# Patient Record
Sex: Male | Born: 1985 | Race: White | Hispanic: No | Smoking: Current every day smoker
Health system: Southern US, Community
[De-identification: ages and names within clinical notes are randomized; demographics above are authoritative.]

---

## 2008-12-06 DEATH — deceased

## 2012-03-03 ENCOUNTER — Emergency Department (HOSPITAL_COMMUNITY)
Admission: EM | Admit: 2012-03-03 | Discharge: 2012-03-03 | Disposition: A | Discharge status: Home / Self Care | Payer: Self-pay | Attending: Emergency Medicine | Admitting: Emergency Medicine

## 2012-03-03 ENCOUNTER — Encounter (HOSPITAL_COMMUNITY): Payer: Self-pay | Admitting: Emergency Medicine

## 2012-03-03 DIAGNOSIS — T23209A Burn of second degree of unspecified hand, unspecified site, initial encounter: Secondary | ICD-10-CM

## 2012-03-03 DIAGNOSIS — X19XXXA Contact with other heat and hot substances, initial encounter: Secondary | ICD-10-CM | POA: Insufficient documentation

## 2012-03-03 DIAGNOSIS — F17200 Nicotine dependence, unspecified, uncomplicated: Secondary | ICD-10-CM | POA: Insufficient documentation

## 2012-03-03 DIAGNOSIS — T23239A Burn of second degree of unspecified multiple fingers (nail), not including thumb, initial encounter: Secondary | ICD-10-CM | POA: Insufficient documentation

## 2012-03-03 DIAGNOSIS — T23259A Burn of second degree of unspecified palm, initial encounter: Secondary | ICD-10-CM | POA: Insufficient documentation

## 2012-03-03 DIAGNOSIS — T310 Burns involving less than 10% of body surface: Secondary | ICD-10-CM | POA: Insufficient documentation

## 2012-03-03 MED ORDER — SILVER SULFADIAZINE 1 % EX CREA
TOPICAL_CREAM | Freq: Once | CUTANEOUS | Status: AC
  Administered 2012-03-03: 13:00:00 via TOPICAL
  Filled 2012-03-03: qty 50

## 2012-03-03 MED ORDER — SILVER SULFADIAZINE 1 % EX CREA: TOPICAL_CREAM | Freq: Two times a day (BID) | CUTANEOUS | Status: DC

## 2012-03-03 MED ORDER — CEPHALEXIN 500 MG PO CAPS: ORAL_CAPSULE | ORAL | Status: DC

## 2012-03-03 MED ORDER — IBUPROFEN 800 MG PO TABS
800.0000 mg | ORAL_TABLET | Freq: Once | ORAL | Status: AC
  Administered 2012-03-03: 800 mg via ORAL
  Filled 2012-03-03: qty 1

## 2012-03-03 MED ORDER — HYDROCODONE-ACETAMINOPHEN 5-500 MG PO TABS: 1.0000 | ORAL_TABLET | Freq: Four times a day (QID) | ORAL | Status: AC | PRN
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
## 2012-03-03 NOTE — Progress Notes (Signed)
[facility] ED CM noted self pay pt from [facility] with no pcp  Offered the following resources [employer] [facility], [employer]. Location: [address] [place], [place] [address] [phone] Services: [facility] patients of all ages receive quality medical care from our on staff physicians and physicians assistant for any variety of health concerns both acute and chronic [facility]: [phone], [address] [employer]
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
## 2012-07-18 ENCOUNTER — Encounter (HOSPITAL_COMMUNITY): Payer: Self-pay | Admitting: Emergency Medicine

## 2012-07-18 ENCOUNTER — Emergency Department (HOSPITAL_COMMUNITY)
Admission: EM | Admit: 2012-07-18 | Discharge: 2012-07-18 | Disposition: A | Discharge status: Home / Self Care | Payer: Self-pay | Attending: Emergency Medicine | Admitting: Emergency Medicine

## 2012-07-18 DIAGNOSIS — L02419 Cutaneous abscess of limb, unspecified: Secondary | ICD-10-CM | POA: Insufficient documentation

## 2012-07-18 DIAGNOSIS — F17200 Nicotine dependence, unspecified, uncomplicated: Secondary | ICD-10-CM | POA: Insufficient documentation

## 2012-07-18 DIAGNOSIS — L0390 Cellulitis, unspecified: Secondary | ICD-10-CM

## 2012-07-18 MED ORDER — IBUPROFEN 600 MG PO TABS: 600.0000 mg | ORAL_TABLET | Freq: Three times a day (TID) | ORAL | Status: DC | PRN

## 2012-07-18 MED ORDER — OXYCODONE-ACETAMINOPHEN 5-325 MG PO TABS
1.0000 | ORAL_TABLET | Freq: Once | ORAL | Status: AC
  Administered 2012-07-18: 1 via ORAL
  Filled 2012-07-18: qty 1

## 2012-07-18 MED ORDER — SULFAMETHOXAZOLE-TRIMETHOPRIM 800-160 MG PO TABS: 1.0000 | ORAL_TABLET | Freq: Two times a day (BID) | ORAL | Status: DC
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
## 2012-07-18 NOTE — ED Notes (Signed)
Pt presenting to ed with c/o abscess to left hip x 3 days. Pt states "I think I got bite by a spider" pt states pain is shooting all the way down my leg

## 2012-07-18 NOTE — ED Notes (Signed)
Pt states he has had an abscess on his L hip x 3 days, yesterday started having sharp/shooting pain from hip up his L side, pt denies drainage from area, states he's been putting alcohol on spot, pt denies nausea/vomiting, states started having diarrhea 2 days ago.

## 2014-05-11 ENCOUNTER — Encounter (HOSPITAL_COMMUNITY): Payer: Self-pay | Admitting: Emergency Medicine

## 2014-05-11 ENCOUNTER — Emergency Department (HOSPITAL_COMMUNITY)
Admission: EM | Admit: 2014-05-11 | Discharge: 2014-05-11 | Disposition: A | Discharge status: Home / Self Care | Payer: Self-pay | Attending: Emergency Medicine | Admitting: Emergency Medicine

## 2014-05-11 DIAGNOSIS — W219XXA Striking against or struck by unspecified sports equipment, initial encounter: Secondary | ICD-10-CM | POA: Insufficient documentation

## 2014-05-11 DIAGNOSIS — F17200 Nicotine dependence, unspecified, uncomplicated: Secondary | ICD-10-CM | POA: Insufficient documentation

## 2014-05-11 DIAGNOSIS — Z792 Long term (current) use of antibiotics: Secondary | ICD-10-CM | POA: Insufficient documentation

## 2014-05-11 DIAGNOSIS — K0889 Other specified disorders of teeth and supporting structures: Secondary | ICD-10-CM

## 2014-05-11 DIAGNOSIS — S025XXA Fracture of tooth (traumatic), initial encounter for closed fracture: Secondary | ICD-10-CM | POA: Insufficient documentation

## 2014-05-11 DIAGNOSIS — Y9367 Activity, basketball: Secondary | ICD-10-CM | POA: Insufficient documentation

## 2014-05-11 DIAGNOSIS — Y92838 Other recreation area as the place of occurrence of the external cause: Secondary | ICD-10-CM

## 2014-05-11 DIAGNOSIS — Y9239 Other specified sports and athletic area as the place of occurrence of the external cause: Secondary | ICD-10-CM | POA: Insufficient documentation

## 2014-05-11 MED ORDER — HYDROCODONE-ACETAMINOPHEN 5-325 MG PO TABS: 1.0000 | ORAL_TABLET | Freq: Four times a day (QID) | ORAL | Status: DC | PRN

## 2014-05-11 MED ORDER — PENICILLIN V POTASSIUM 500 MG PO TABS: 500.0000 mg | ORAL_TABLET | Freq: Four times a day (QID) | ORAL | Status: DC

## 2014-05-11 NOTE — ED Provider Notes (Signed)
Medical screening examination/treatment/procedure(s) were performed by non-physician practitioner and as supervising physician I was immediately available for consultation/collaboration.   EKG Interpretation None      Arianne Klinge, MD, FACEP   Meiya Wisler L Bethsaida Siegenthaler, MD 05/11/14 2237 

## 2014-05-11 NOTE — Discharge Instructions (Signed)
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
## 2014-05-11 NOTE — ED Provider Notes (Signed)
CSN: [id number]     Arrival date & time 05/11/14  1506 History  This chart was scribed for a non-physician practitioner, Creola Corn, PA-C working with Ward Givens, MD by [place] Peace, ED Scribe. The patient was seen in [facility]. The patient's care was started at 4:20 PM.    Chief Complaint  Patient presents with  . Dental Injury    trauma to tooth in r/lower jaw     Patient is a 28 y.o. male presenting with tooth pain. The history is provided by the patient. No language interpreter was used.  Dental Pain Location:  Lower Quality:  Throbbing Severity:  Severe Duration:  2 days Progression:  Unchanged Context: crown fracture   Associated symptoms: facial pain   Associated symptoms: no congestion, no difficulty swallowing and no facial swelling   Risk factors: lack of dental care and smoking   HPI Comments: Vincent Craig is a 28 y.o. male who presents to the Emergency Department complaining of dental pain to right lower aspect of mouth onset 2 days where he reports that he was hit with an elbow while playing basketball. Pt states that he sustained a broken tooth and has been in severe pain since. He reports that pain is at it's worst at night and causes him to experience some trouble sleeping. He denies currently having a dentist. Pt is current every day smoker (1 pack/daily).   History reviewed. No pertinent past medical history. History reviewed. No pertinent past surgical history. History reviewed. No pertinent family history. History  Substance Use Topics  . Smoking status: Current Every Day Smoker -- 1.00 packs/day    Types: Cigarettes  . Smokeless tobacco: Not on file  . Alcohol Use: Yes     Comment: weekends    Review of Systems  HENT: Positive for dental problem (right lower). Negative for congestion and facial swelling.   Gastrointestinal: Negative for nausea and vomiting.  Psychiatric/Behavioral:       Trouble sleeping.       Allergies  Review of patient's  allergies indicates no known allergies.  Home Medications   Prior to Admission medications   Medication Sig Start Date End Date Taking? Authorizing Provider  acetaminophen (TYLENOL) 500 MG tablet Take 500 mg by mouth every 6 (six) hours as needed. For pain    Historical Provider, MD  ibuprofen (ADVIL,MOTRIN) 600 MG tablet Take 1 tablet (600 mg total) by mouth every 8 (eight) hours as needed for pain. 07/18/12   Lyanne Co, MD  sulfamethoxazole-trimethoprim (SEPTRA DS) 800-160 MG per tablet Take 1 tablet by mouth every 12 (twelve) hours. 07/18/12   Lyanne Co, MD   BP 119/73  Pulse 84  Temp(Src) 98.6 F (37 C) (Oral)  Resp 18  Wt 160 lb (72.576 kg)  SpO2 98% Physical Exam  Nursing note and vitals reviewed. Constitutional: He is oriented to person, place, and time. He appears well-developed and well-nourished. No distress.  HENT:  Head: Normocephalic and atraumatic.  Mouth/Throat:    Fair dentition throughout.  Affected tooth as diagrammed.  No signs of peritonsillar or tonsillar abscess.  No signs of gingival abscess. Oropharynx is clear and without exudates.  Uvula is midline.  Airway is intact. No signs of Ludwig's angina with palpation of oral and sublingual mucosa.   Eyes: Conjunctivae and EOM are normal.  Neck: Neck supple. No tracheal deviation present.  Cardiovascular: Normal rate.   Pulmonary/Chest: Effort normal. No respiratory distress.  Musculoskeletal: Normal range of motion.  Neurological:  He is alert and oriented to person, place, and time.  Skin: Skin is warm and dry.  Psychiatric: He has a normal mood and affect. His behavior is normal.    ED Course  Procedures (including critical care time) Labs Review Labs Reviewed - No data to display  No results found for this or any previous visit. No results found.   Imaging Review No results found.   EKG Interpretation None     Medications - No data to display  4:25 PM- Treatment plan was discussed  with patient who verbalizes understanding and agrees.   MDM   Final diagnoses:  Pain, dental    Patient with toothache.  No gross abscess.  Exam unconcerning for Ludwig's angina or spread of infection.  Will treat with penicillin and pain medicine.  Urged patient to follow-up with dentist.     I personally performed the services described in this documentation, which was scribed in my presence. The recorded information has been reviewed and is accurate.     Roxy Horseman, PA-C 05/11/14 1655

## 2015-09-13 ENCOUNTER — Encounter (HOSPITAL_COMMUNITY): Payer: Self-pay | Admitting: Emergency Medicine

## 2015-09-13 ENCOUNTER — Emergency Department (HOSPITAL_COMMUNITY): Payer: Self-pay

## 2015-09-13 ENCOUNTER — Inpatient Hospital Stay (HOSPITAL_COMMUNITY)
Admission: EM | Admit: 2015-09-13 | Discharge: 2015-09-16 | DRG: 443 | Disposition: A | Discharge status: Home / Self Care | Payer: Self-pay | Attending: Internal Medicine | Admitting: Internal Medicine

## 2015-09-13 DIAGNOSIS — B182 Chronic viral hepatitis C: Secondary | ICD-10-CM | POA: Diagnosis present

## 2015-09-13 DIAGNOSIS — R7401 Elevation of levels of liver transaminase levels: Secondary | ICD-10-CM | POA: Diagnosis present

## 2015-09-13 DIAGNOSIS — R740 Nonspecific elevation of levels of transaminase and lactic acid dehydrogenase [LDH]: Secondary | ICD-10-CM

## 2015-09-13 DIAGNOSIS — B1710 Acute hepatitis C without hepatic coma: Principal | ICD-10-CM | POA: Diagnosis present

## 2015-09-13 DIAGNOSIS — R17 Unspecified jaundice: Secondary | ICD-10-CM | POA: Diagnosis present

## 2015-09-13 DIAGNOSIS — F1290 Cannabis use, unspecified, uncomplicated: Secondary | ICD-10-CM | POA: Diagnosis present

## 2015-09-13 DIAGNOSIS — K7200 Acute and subacute hepatic failure without coma: Secondary | ICD-10-CM

## 2015-09-13 DIAGNOSIS — K759 Inflammatory liver disease, unspecified: Secondary | ICD-10-CM | POA: Insufficient documentation

## 2015-09-13 DIAGNOSIS — F17210 Nicotine dependence, cigarettes, uncomplicated: Secondary | ICD-10-CM | POA: Diagnosis present

## 2015-09-13 DIAGNOSIS — F1010 Alcohol abuse, uncomplicated: Secondary | ICD-10-CM | POA: Diagnosis present

## 2015-09-13 DIAGNOSIS — E876 Hypokalemia: Secondary | ICD-10-CM | POA: Diagnosis present

## 2015-09-13 LAB — CBC WITH DIFFERENTIAL/PLATELET
Basophils Absolute: 0.1 10*3/uL (ref 0.0–0.1)
Basophils Relative: 1 %
EOS ABS: 0.3 10*3/uL (ref 0.0–0.7)
Eosinophils Relative: 3 %
HEMATOCRIT: 36.9 % — AB (ref 39.0–52.0)
HEMOGLOBIN: 12.8 g/dL — AB (ref 13.0–17.0)
LYMPHS ABS: 1.7 10*3/uL (ref 0.7–4.0)
Lymphocytes Relative: 17 %
MCH: 31.8 pg (ref 26.0–34.0)
MCHC: 34.7 g/dL (ref 30.0–36.0)
MCV: 91.6 fL (ref 78.0–100.0)
MONO ABS: 1.6 10*3/uL — AB (ref 0.1–1.0)
MONOS PCT: 16 %
NEUTROS PCT: 63 %
Neutro Abs: 6 10*3/uL (ref 1.7–7.7)
Platelets: 378 10*3/uL (ref 150–400)
RBC: 4.03 MIL/uL — ABNORMAL LOW (ref 4.22–5.81)
RDW: 15.3 % (ref 11.5–15.5)
WBC: 9.6 10*3/uL (ref 4.0–10.5)

## 2015-09-13 LAB — COMPREHENSIVE METABOLIC PANEL
ALK PHOS: 262 U/L — AB (ref 38–126)
ALT: 1045 U/L — ABNORMAL HIGH (ref 17–63)
ANION GAP: 10 (ref 5–15)
AST: 822 U/L — ABNORMAL HIGH (ref 15–41)
Albumin: 3.3 g/dL — ABNORMAL LOW (ref 3.5–5.0)
BILIRUBIN TOTAL: 20.6 mg/dL — AB (ref 0.3–1.2)
BUN: 12 mg/dL (ref 6–20)
CALCIUM: 9.3 mg/dL (ref 8.9–10.3)
CO2: 28 mmol/L (ref 22–32)
CREATININE: 0.7 mg/dL (ref 0.61–1.24)
Chloride: 97 mmol/L — ABNORMAL LOW (ref 101–111)
GFR calc non Af Amer: >60 mL/min (ref >60)
Glucose, Bld: 127 mg/dL — ABNORMAL HIGH (ref 65–99)
Potassium: 3.4 mmol/L — ABNORMAL LOW (ref 3.5–5.1)
SODIUM: 135 mmol/L (ref 135–145)
TOTAL PROTEIN: 6.3 g/dL — AB (ref 6.5–8.1)

## 2015-09-13 LAB — URINALYSIS, ROUTINE W REFLEX MICROSCOPIC
Glucose, UA: NEGATIVE mg/dL
HGB URINE DIPSTICK: NEGATIVE
KETONES UR: NEGATIVE mg/dL
NITRITE: POSITIVE — AB
PROTEIN: NEGATIVE mg/dL
SPECIFIC GRAVITY, URINE: 1.024 (ref 1.005–1.030)
pH: 6 (ref 5.0–8.0)

## 2015-09-13 LAB — MAGNESIUM: MAGNESIUM: 2.1 mg/dL (ref 1.7–2.4)

## 2015-09-13 LAB — RAPID URINE DRUG SCREEN, HOSP PERFORMED
Amphetamines: POSITIVE — AB
BENZODIAZEPINES: NOT DETECTED
Barbiturates: NOT DETECTED
COCAINE: NOT DETECTED
OPIATES: POSITIVE — AB
TETRAHYDROCANNABINOL: POSITIVE — AB

## 2015-09-13 LAB — CBC
HCT: 37.9 % — ABNORMAL LOW (ref 39.0–52.0)
HEMOGLOBIN: 13 g/dL (ref 13.0–17.0)
MCH: 31.6 pg (ref 26.0–34.0)
MCHC: 34.3 g/dL (ref 30.0–36.0)
MCV: 92.2 fL (ref 78.0–100.0)
Platelets: 387 10*3/uL (ref 150–400)
RBC: 4.11 MIL/uL — ABNORMAL LOW (ref 4.22–5.81)
RDW: 15.4 % (ref 11.5–15.5)
WBC: 8.9 10*3/uL (ref 4.0–10.5)

## 2015-09-13 LAB — HEPATIC FUNCTION PANEL
ALT: 1022 U/L — AB (ref 17–63)
AST: 859 U/L — AB (ref 15–41)
Albumin: 3.5 g/dL (ref 3.5–5.0)
Alkaline Phosphatase: 274 U/L — ABNORMAL HIGH (ref 38–126)
BILIRUBIN DIRECT: 11.7 mg/dL — AB (ref 0.1–0.5)
BILIRUBIN TOTAL: 20.4 mg/dL — AB (ref 0.3–1.2)
Indirect Bilirubin: 8.7 mg/dL — ABNORMAL HIGH (ref 0.3–0.9)
Total Protein: 6.6 g/dL (ref 6.5–8.1)

## 2015-09-13 LAB — URINE MICROSCOPIC-ADD ON

## 2015-09-13 LAB — PHOSPHORUS: Phosphorus: 3.1 mg/dL (ref 2.5–4.6)

## 2015-09-13 LAB — ACETAMINOPHEN LEVEL: Acetaminophen (Tylenol), Serum: <10 ug/mL — ABNORMAL LOW (ref 10–30)

## 2015-09-13 LAB — ETHANOL

## 2015-09-13 LAB — LIPASE, BLOOD: LIPASE: 17 U/L (ref 11–51)

## 2015-09-13 LAB — APTT: APTT: 34 s (ref 24–37)

## 2015-09-13 LAB — PROTIME-INR
INR: 1.06 (ref 0.00–1.49)
PROTHROMBIN TIME: 14 s (ref 11.6–15.2)

## 2015-09-13 LAB — TSH: TSH: 0.482 u[IU]/mL (ref 0.350–4.500)

## 2015-09-13 MED ORDER — SODIUM CHLORIDE 0.9 % IV BOLUS (SEPSIS)
1000.0000 mL | Freq: Once | INTRAVENOUS | Status: AC
  Administered 2015-09-13: 1000 mL via INTRAVENOUS

## 2015-09-13 MED ORDER — LORAZEPAM 2 MG/ML IJ SOLN
1.0000 mg | Freq: Four times a day (QID) | INTRAMUSCULAR | Status: DC | PRN
  Administered 2015-09-14: 1 mg via INTRAVENOUS
  Filled 2015-09-13: qty 1

## 2015-09-13 MED ORDER — ONDANSETRON HCL 4 MG/2ML IJ SOLN
4.0000 mg | Freq: Once | INTRAMUSCULAR | Status: AC
  Administered 2015-09-13: 4 mg via INTRAVENOUS
  Filled 2015-09-13: qty 2

## 2015-09-13 MED ORDER — SODIUM CHLORIDE 0.9 % IJ SOLN
3.0000 mL | Freq: Two times a day (BID) | INTRAMUSCULAR | Status: DC
  Administered 2015-09-13 – 2015-09-14 (×2): 3 mL via INTRAVENOUS

## 2015-09-13 MED ORDER — FOLIC ACID 1 MG PO TABS
1.0000 mg | ORAL_TABLET | Freq: Every day | ORAL | Status: DC
  Administered 2015-09-13 – 2015-09-15 (×3): 1 mg via ORAL
  Filled 2015-09-13 (×4): qty 1

## 2015-09-13 MED ORDER — THIAMINE HCL 100 MG/ML IJ SOLN
100.0000 mg | Freq: Every day | INTRAMUSCULAR | Status: DC
  Filled 2015-09-13 (×4): qty 1

## 2015-09-13 MED ORDER — ONDANSETRON HCL 4 MG PO TABS: 4.0000 mg | ORAL_TABLET | Freq: Four times a day (QID) | ORAL | Status: DC | PRN

## 2015-09-13 MED ORDER — MORPHINE SULFATE (PF) 4 MG/ML IV SOLN
4.0000 mg | Freq: Once | INTRAVENOUS | Status: AC
  Administered 2015-09-13: 4 mg via INTRAVENOUS
  Filled 2015-09-13: qty 1

## 2015-09-13 MED ORDER — ENOXAPARIN SODIUM 40 MG/0.4ML ~~LOC~~ SOLN
40.0000 mg | SUBCUTANEOUS | Status: DC
  Administered 2015-09-13 – 2015-09-15 (×3): 40 mg via SUBCUTANEOUS
  Filled 2015-09-13 (×4): qty 0.4

## 2015-09-13 MED ORDER — ADULT MULTIVITAMIN W/MINERALS CH
1.0000 | ORAL_TABLET | Freq: Every day | ORAL | Status: DC
  Administered 2015-09-13 – 2015-09-15 (×3): 1 via ORAL
  Filled 2015-09-13 (×4): qty 1

## 2015-09-13 MED ORDER — VITAMIN B-1 100 MG PO TABS
100.0000 mg | ORAL_TABLET | Freq: Every day | ORAL | Status: DC
  Administered 2015-09-13 – 2015-09-15 (×3): 100 mg via ORAL
  Filled 2015-09-13 (×4): qty 1

## 2015-09-13 MED ORDER — ONDANSETRON HCL 4 MG/2ML IJ SOLN
4.0000 mg | Freq: Four times a day (QID) | INTRAMUSCULAR | Status: DC | PRN
  Filled 2015-09-13: qty 2

## 2015-09-13 MED ORDER — OXYCODONE HCL 5 MG PO TABS
5.0000 mg | ORAL_TABLET | ORAL | Status: DC | PRN
  Administered 2015-09-13 – 2015-09-16 (×9): 5 mg via ORAL
  Filled 2015-09-13 (×9): qty 1

## 2015-09-13 MED ORDER — DIPHENHYDRAMINE HCL 25 MG PO CAPS
25.0000 mg | ORAL_CAPSULE | Freq: Four times a day (QID) | ORAL | Status: DC | PRN
  Administered 2015-09-13: 25 mg via ORAL
  Filled 2015-09-13: qty 1

## 2015-09-13 MED ORDER — LORAZEPAM 1 MG PO TABS
1.0000 mg | ORAL_TABLET | Freq: Four times a day (QID) | ORAL | Status: DC | PRN
  Administered 2015-09-13 – 2015-09-15 (×4): 1 mg via ORAL
  Filled 2015-09-13 (×4): qty 1

## 2015-09-13 MED ORDER — SODIUM CHLORIDE 0.9 % IV SOLN
INTRAVENOUS | Status: DC
  Administered 2015-09-13 – 2015-09-15 (×4): via INTRAVENOUS

## 2015-09-13 NOTE — ED Provider Notes (Signed)
CSN: [id number]     Arrival date & time 09/13/15  4098 History   First MD Initiated Contact with Patient 09/13/15 [phone]     Chief Complaint  Patient presents with  . Jaundice     (Consider location/radiation/quality/duration/timing/severity/associated sxs/prior Treatment) HPI 30 year old male who presents with concern for jaundice. History of chronic alcohol abuse, which he states that he drinks 3-4 24 pack cases of beer daily. States he has been drinking alcohol since age of 26. States that 2 weeks ago he began to notice generalized myalgias, arthralgias, fatigue, and decreased appetite. Associated with generalized abdominal pain, intermittent nausea and vomiting. Also noticing that his stools have become white in color and that his urine is bright yellow. Has had subjective fevers and chills, also with coughing productive of sputum, congestion and runny nose. More recently over the past several days he has been noticing yellowing of his skin and yellowing of the whites of his eyes. He presents today for evaluation. No recent traveling, and states that he has only done IV drug use once in his life. Denies tylenol ingestion.   History reviewed. No pertinent past medical history. History reviewed. No pertinent past surgical history. History reviewed. No pertinent family history. Social History  Substance Use Topics  . Smoking status: Current Every Day Smoker -- 2.50 packs/day    Types: Cigarettes  . Smokeless tobacco: None  . Alcohol Use: Yes     Comment: 3-4 cases of beer/day    Review of Systems 10/14 systems reviewed and are negative other than those stated in the HPI   Allergies  Review of patient's allergies indicates no known allergies.  Home Medications   Prior to Admission medications   Medication Sig Start Date End Date Taking? Authorizing Provider  acetaminophen (TYLENOL) 500 MG tablet Take 500 mg by mouth every 6 (six) hours as needed. For pain   Yes Historical Provider,  MD  HYDROcodone-acetaminophen (NORCO/VICODIN) 5-325 MG per tablet Take 1-2 tablets by mouth every 6 (six) hours as needed for moderate pain or severe pain. Patient not taking: Reported on 09/13/2015 05/11/14   Roxy Horseman, PA-C  ibuprofen (ADVIL,MOTRIN) 600 MG tablet Take 1 tablet (600 mg total) by mouth every 8 (eight) hours as needed for pain. Patient not taking: Reported on 09/13/2015 07/18/12   Azalia Bilis, MD  penicillin v potassium (VEETID) 500 MG tablet Take 1 tablet (500 mg total) by mouth 4 (four) times daily. Patient not taking: Reported on 09/13/2015 05/11/14   Roxy Horseman, PA-C  sulfamethoxazole-trimethoprim (SEPTRA DS) 800-160 MG per tablet Take 1 tablet by mouth every 12 (twelve) hours. Patient not taking: Reported on 09/13/2015 07/18/12   Azalia Bilis, MD   BP 118/85 mmHg  Pulse 98  Temp(Src) 98.1 F (36.7 C) (Oral)  Resp 16  Ht 5\' 11"  (1.803 m)  Wt 160 lb (72.576 kg)  BMI 22.33 kg/m2  SpO2 96% Physical Exam Physical Exam  Nursing note and vitals reviewed. Constitutional:  non-toxic, and in no acute distress  Head: Normocephalic and atraumatic.  Mouth/Throat: Oropharynx is clear. Mucous membranes are dry. Eyes: bilateral scleral icterus Neck: Normal range of motion. Neck supple.  Cardiovascular: Normal rate and regular rhythm.   Pulmonary/Chest: Effort normal and breath sounds normal.  Abdominal: Soft. There is no tenderness. There is no rebound and no guarding.  Musculoskeletal: Normal range of motion.  Neurological: Alert, no facial droop, fluent speech, moves all extremities symmetrically Skin: Skin appears jaundiced, greater in upper extremity and toro Psychiatric:  Cooperative  ED Course  Procedures (including critical care time) Labs Review Labs Reviewed  CBC WITH DIFFERENTIAL/PLATELET - Abnormal; Notable for the following:    RBC 4.03 (*)    Hemoglobin 12.8 (*)    HCT 36.9 (*)    Monocytes Absolute 1.6 (*)    All other components within normal limits   COMPREHENSIVE METABOLIC PANEL - Abnormal; Notable for the following:    Potassium 3.4 (*)    Chloride 97 (*)    Glucose, Bld 127 (*)    Total Protein 6.3 (*)    Albumin 3.3 (*)    AST 822 (*)    ALT 1045 (*)    Alkaline Phosphatase 262 (*)    Total Bilirubin 20.6 (*)    All other components within normal limits  URINALYSIS, ROUTINE W REFLEX MICROSCOPIC (NOT AT [facility]) - Abnormal; Notable for the following:    Color, Urine ORANGE (*)    Bilirubin Urine LARGE (*)    Nitrite POSITIVE (*)    Leukocytes, UA SMALL (*)    All other components within normal limits  URINE MICROSCOPIC-ADD ON - Abnormal; Notable for the following:    Squamous Epithelial / LPF 0-5 (*)    Bacteria, UA FEW (*)    Crystals CA OXALATE CRYSTALS (*)    All other components within normal limits  LIPASE, BLOOD  ETHANOL  HEPATITIS PANEL, ACUTE  ACETAMINOPHEN LEVEL  PROTIME-INR  APTT    Imaging Review Dg Chest 2 View  09/13/2015  CLINICAL DATA:  Chest pain. Shortness of breath. Coughing, 3 days duration. EXAM: CHEST  2 VIEW COMPARISON:  None. FINDINGS: Heart size is normal. Mediastinal shadows are normal. The lungs are clear. The vascularity is normal. No effusions. No bony abnormalities. IMPRESSION: Normal chest Electronically Signed   By: Paulina FusiMark  Shogry M.D.   On: 09/13/2015 10:15   [place] Abdomen Limited Ruq  09/13/2015  CLINICAL DATA:  Jaundice EXAM: US ABDOMEN LIMITED - RIGHT UPPER QUADRANT COMPARISON:  None. FINDINGS: Gallbladder: 5 mm polyp fundus of gallbladder. This is nonmobile and does not shadow along the nondependent surface. Gallbladder wall upper normal thickness 3.7 mm. Negative sonographic Murphy sign Common bile duct: Diameter: 5.5 mm. Liver: No focal lesion identified. Within normal limits in parenchymal echogenicity. IMPRESSION: Gallbladder polyp with mild gallbladder wall thickening. Possible adenomyosis. No gallstone or biliary ductal dilatation. Electronically Signed   By: Marlan Palauharles  Clark M.D.   On:  09/13/2015 10:49   I have personally reviewed and evaluated these images and lab results as part of my medical decision-making.   EKG Interpretation None      MDM   Final diagnoses:  Jaundice  Hepatitis    30 year old male with history of chronic alcohol abuse who presents with new onset jaundice. Is in no acute distress on presentation and hemodynamically stable. Appears jaundiced on exam, has an overall soft and nonsurgical abdomen with diffuse tenderness. Blood work concerning for transaminitis with hyperbilirubinemia of 20.6. Right upper quadrant ultrasound without evidence of biliary dilatation or other acute hepatobiliary processes. Overall clinical presentation seems consistent with acute hepatitis, alcoholic versus viral in etiology. GI notified to evaluate patient as an inpatient. Discussed with Dr. Izola PriceMyers from [facility] hospitalist who will admit for ongoing management.    Lavera Guiseana Duo Liu, MD 09/13/15 [phone]

## 2015-09-13 NOTE — Progress Notes (Signed)
Pt with address for [facility] Pt confirms this with CM Pt states no pcp has been seen, no insurance coverage and [facility] [place]  Cm discussed importance of follow up care after hospitalization within 1-2 weeks after d/c Cm  CM discussed and provided pt with the free clinic services of [place] to include   [facility] [place], [place]- [id number]  [phone] ? Sliding Scale  [facility] [place], [place] -- [id number]  [phone]

## 2015-09-13 NOTE — H&P (Signed)
[facility] History and Physical  Melinda Pottinger WUJ:[id number] DOB: 15-Sep-1985 DOA: 09/13/2015  Referring physician: ED physician, Dr. Verdie Mosher  PCP: Pt has no PCP  Chief Complaint: jaundice   HPI:  Pt is 30 yo male with known alcohol use, typically drinks 3 24 pack cases beer per day, since age of 8, presented to [facility] emergency department for evaluation of several weeks duration of progressively worsening jaundice, generalized myalgias, arthralgias, fatigue, poor oral intake. He also reports subjective fevers and chills, intermittently productive cough of yellow sputum, night sweats. Patient denies chest pain, no abdominal pain, no specific urinary concerns other than urine color that bright yellow. Patient denies recent traveling and reports using IV drugs once in his life.  In emergency department, patient is hemodynamically stable, vital signs stable. Blood work notable for AST 822, ALT 1045, bilirubin 20. Abdominal ultrasound with mild gallbladder wall thickening but no gallstones. [facility] asked to admit for further evaluation. GI consultation requested.   Assessment and Plan: Active Poblems: Jaundice with significant transaminitis - unclear etiology and while pt has history of significant alcohol abuse, viral etiology has to be considered - Hepatitis panel requested  - Provide IV fluids, avoid hepatotoxic medications  - Follow up on GI team recommendations  - CMP in the morning  - HIV and UDS requested   Hypokalemia - Supplement and repeat BMP in the morning  Alcohol abuse - Cessation consult provided - Keep on fever protocol  Lovenox for DVT prophylaxis  Radiological Exams on Admission: Dg Chest 2 View  09/13/2015  CLINICAL DATA:  Chest pain. Shortness of breath. Coughing, 3 days duration. EXAM: CHEST  2 VIEW COMPARISON:  None. FINDINGS: Heart size is normal. Mediastinal shadows are normal. The lungs are clear. The vascularity is normal. No effusions. No bony  abnormalities. IMPRESSION: Normal chest Electronically Signed   By: Paulina Fusi M.D.   On: 09/13/2015 10:15   US Abdomen Limited Ruq  09/13/2015  CLINICAL DATA:  Jaundice EXAM: US ABDOMEN LIMITED - RIGHT UPPER QUADRANT COMPARISON:  None. FINDINGS: Gallbladder: 5 mm polyp fundus of gallbladder. This is nonmobile and does not shadow along the nondependent surface. Gallbladder wall upper normal thickness 3.7 mm. Negative sonographic Murphy sign Common bile duct: Diameter: 5.5 mm. Liver: No focal lesion identified. Within normal limits in parenchymal echogenicity. IMPRESSION: Gallbladder polyp with mild gallbladder wall thickening. Possible adenomyosis. No gallstone or biliary ductal dilatation. Electronically Signed   By: Marlan Palau M.D.   On: 09/13/2015 10:49     Code Status: Full Family Communication: Pt at bedside Disposition Plan: Admit for further evaluation    Danie Binder [facility] [phone]   Review of Systems:  Constitutional: Negative for diaphoresis.  HENT: Negative for hearing loss, ear pain, nosebleeds, congestion, sore throat, neck pain, tinnitus and ear discharge.   Eyes: Negative for blurred vision, double vision, photophobia, pain, discharge and redness.  Respiratory: Negative for cough, hemoptysis, sputum production, shortness of breath, wheezing and stridor.   Cardiovascular: Negative for chest pain, palpitations, orthopnea, claudication and leg swelling.  Gastrointestinal:  Negative for heartburn, constipation, blood in stool and melena.  Genitourinary: Negative for dysuria, urgency, frequency, hematuria and flank pain.  Musculoskeletal: Negative for falls.  Skin: Negative for itching and rash.  Neurological: Negative for dizziness and weakness.  Endo/Heme/Allergies: Negative for environmental allergies and polydipsia. Does not bruise/bleed easily.  Psychiatric/Behavioral: Negative for suicidal ideas. The patient is not nervous/anxious.      Past medical history of alcohol  abuse  Social History:  reports that he has been smoking Cigarettes.  He has been smoking about 2.50 packs per day. He does not have any smokeless tobacco history on file. He reports that he drinks alcohol. He reports that he uses illicit drugs (Marijuana).  No Known Allergies  Patient denies family history of hypertension or diabetes. Patient reports father had hepatitis  Prior to Admission medications   Medication Sig Start Date End Date Taking? Authorizing Provider  acetaminophen (TYLENOL) 500 MG tablet Take 500 mg by mouth every 6 (six) hours as needed. For pain   Yes Historical Provider, MD  HYDROcodone-acetaminophen (NORCO/VICODIN) 5-325 MG per tablet Take 1-2 tablets by mouth every 6 (six) hours as needed for moderate pain or severe pain. Patient not taking: Reported on 09/13/2015 05/11/14   Roxy Horsemanobert Browning, PA-C  ibuprofen (ADVIL,MOTRIN) 600 MG tablet Take 1 tablet (600 mg total) by mouth every 8 (eight) hours as needed for pain. Patient not taking: Reported on 09/13/2015 07/18/12   Azalia BilisKevin Campos, MD  penicillin v potassium (VEETID) 500 MG tablet Take 1 tablet (500 mg total) by mouth 4 (four) times daily. Patient not taking: Reported on 09/13/2015 05/11/14   Roxy Horsemanobert Browning, PA-C  sulfamethoxazole-trimethoprim (SEPTRA DS) 800-160 MG per tablet Take 1 tablet by mouth every 12 (twelve) hours. Patient not taking: Reported on 09/13/2015 07/18/12   Azalia BilisKevin Campos, MD    Physical Exam: Filed Vitals:   09/13/15 0911  BP: 118/85  Pulse: 98  Temp: 98.1 F (36.7 C)  TempSrc: Oral  Resp: 16  Height: 5\' 11"  (1.803 m)  Weight: 72.576 kg (160 lb)  SpO2: 96%    Physical Exam  Constitutional: Appears well-developed and well-nourished. No distress.  HENT: Normocephalic. External right and left ear normal. Oropharynx is clear and moist.  Eyes: EOMI, sclera icterus  Neck: Normal ROM. Neck supple. No JVD. No tracheal deviation. No thyromegaly.  CVS: RRR, S1/S2 +, no murmurs, no gallops, no carotid  bruit.  Pulmonary: Effort and breath sounds normal, no stridor, rhonchi, wheezes, rales.  Abdominal: Soft. BS +,  no distension, tenderness, rebound or guarding.  Musculoskeletal: Normal range of motion. No edema and no tenderness.  Lymphadenopathy: No lymphadenopathy noted, cervical, inguinal. Neuro: Alert. Normal reflexes, muscle tone coordination. No cranial nerve deficit. Skin: jaundiced skin with multiple tattoos  Psychiatric: Normal mood and affect. Behavior, judgment, thought content normal.   Labs on Admission:  Basic Metabolic Panel:  Recent Labs Lab 09/13/15 0940  NA 135  K 3.4*  CL 97*  CO2 28  GLUCOSE 127*  BUN 12  CREATININE 0.70  CALCIUM 9.3   Liver Function Tests:  Recent Labs Lab 09/13/15 0940  AST 822*  ALT 1045*  ALKPHOS 262*  BILITOT 20.6*  PROT 6.3*  ALBUMIN 3.3*    Recent Labs Lab 09/13/15 0940  LIPASE 17   CBC:  Recent Labs Lab 09/13/15 0940  WBC 9.6  NEUTROABS 6.0  HGB 12.8*  HCT 36.9*  MCV 91.6  PLT 378    EKG:  Pending    If 7PM-7AM, please contact night-coverage [email or web address] Password [email or web address] 09/13/2015, 11:18 AM

## 2015-09-13 NOTE — Consult Note (Addendum)
Referring Provider: Dr. Izola Price, [facility] Primary Care Physician:  No primary care provider on file. Primary Gastroenterologist:  None, unassigned  Reason for Consultation:  Elevated LFT's  HPI: Vincent Craig is a 30 y.o. male with known alcohol use, typically drinks 3-24 pack cases beer per day, since age of 1, presented to [facility] emergency department for evaluation of several weeks duration of progressively worsening jaundice, generalized myalgias, arthralgias, fatigue, poor oral intake, and nausea/vomiting.  He actually just noticed the jaundice a couple of days ago.    In emergency department  LFT's elevated with total bili 20.6, AST 822, ALT1045, and ALP 262.  Ultrasound showed the following:  IMPRESSION: Gallbladder polyp with mild gallbladder wall thickening. Possible adenomyosis. No gallstone or biliary ductal dilatation.  Viral hepatitis studies pending.  PT/INR ok.  Tylenol level negative.  UDS positive for opiates, THC, and amphetamines.  Tells me that he does use cocaine some and takes "pills" that he gets from people that he knows.  Denies any other OTC meds/supplements.  History reviewed. No pertinent past medical history.  History reviewed. No pertinent past surgical history.  Prior to Admission medications   Medication Sig Start Date End Date Taking? Authorizing Provider  acetaminophen (TYLENOL) 500 MG tablet Take 500 mg by mouth every 6 (six) hours as needed. For pain   Yes Historical Provider, MD  HYDROcodone-acetaminophen (NORCO/VICODIN) 5-325 MG per tablet Take 1-2 tablets by mouth every 6 (six) hours as needed for moderate pain or severe pain. Patient not taking: Reported on 09/13/2015 05/11/14   Roxy Horseman, PA-C  ibuprofen (ADVIL,MOTRIN) 600 MG tablet Take 1 tablet (600 mg total) by mouth every 8 (eight) hours as needed for pain. Patient not taking: Reported on 09/13/2015 07/18/12   Azalia Bilis, MD  penicillin v potassium (VEETID) 500 MG tablet Take 1 tablet  (500 mg total) by mouth 4 (four) times daily. Patient not taking: Reported on 09/13/2015 05/11/14   Roxy Horseman, PA-C  sulfamethoxazole-trimethoprim (SEPTRA DS) 800-160 MG per tablet Take 1 tablet by mouth every 12 (twelve) hours. Patient not taking: Reported on 09/13/2015 07/18/12   Azalia Bilis, MD    No current facility-administered medications for this encounter.   Current Outpatient Prescriptions  Medication Sig Dispense Refill  . acetaminophen (TYLENOL) 500 MG tablet Take 500 mg by mouth every 6 (six) hours as needed. For pain    . HYDROcodone-acetaminophen (NORCO/VICODIN) 5-325 MG per tablet Take 1-2 tablets by mouth every 6 (six) hours as needed for moderate pain or severe pain. (Patient not taking: Reported on 09/13/2015) 15 tablet 0  . ibuprofen (ADVIL,MOTRIN) 600 MG tablet Take 1 tablet (600 mg total) by mouth every 8 (eight) hours as needed for pain. (Patient not taking: Reported on 09/13/2015) 15 tablet 0  . penicillin v potassium (VEETID) 500 MG tablet Take 1 tablet (500 mg total) by mouth 4 (four) times daily. (Patient not taking: Reported on 09/13/2015) 40 tablet 0  . sulfamethoxazole-trimethoprim (SEPTRA DS) 800-160 MG per tablet Take 1 tablet by mouth every 12 (twelve) hours. (Patient not taking: Reported on 09/13/2015) 14 tablet 0    Allergies as of 09/13/2015  . (No Known Allergies)    History reviewed. No pertinent family history.  Social History   Social History  . Marital Status: Divorced    Spouse Name: N/A  . Number of Children: N/A  . Years of Education: N/A   Occupational History  . Not on file.   Social History Main Topics  . Smoking  status: Current Every Day Smoker -- 2.50 packs/day    Types: Cigarettes  . Smokeless tobacco: Not on file  . Alcohol Use: Yes     Comment: 3-4 cases of beer/day  . Drug Use: Yes    Special: Marijuana     Comment: last use 4 weeks ago  . Sexual Activity: Not on file   Other Topics Concern  . Not on file   Social History  Narrative    Review of Systems: Ten point ROS is O/W negative except as mentioned in HPI.  Physical Exam: Vital signs in last 24 hours: Temp:  [98.1 F (36.7 C)-98.5 F (36.9 C)] 98.5 F (36.9 C) (01/06 1144) Pulse Rate:  [79-98] 79 (01/06 1144) Resp:  [16] 16 (01/06 1144) BP: (118-122)/(67-85) 122/67 mmHg (01/06 1144) SpO2:  [96 %-100 %] 100 % (01/06 1144) Weight:  [160 lb (72.576 kg)] 160 lb (72.576 kg) (01/06 0911)   General:  Alert, Well-developed, well-nourished, pleasant and cooperative in NAD; somewhat drowsy with slurring of speech at times, but holding conversation well Head:  Normocephalic and atraumatic. Eyes:  Scleral icterus noted. Ears:  Normal auditory acuity. Mouth:  No deformity or lesions.   Lungs:  Clear throughout to auscultation.   No wheezes, crackles, or rhonchi.  Heart:  Regular rate and rhythm; no murmurs, clicks, rubs, or gallops. Abdomen:  Soft, non-distended.  BS present.  Non-tender. Rectal:  Deferred  Msk:  Symmetrical without gross deformities. Pulses:  Normal pulses noted. Extremities:  Without clubbing or edema. Neurologic:  Alert and  oriented x4;  grossly normal neurologically. Skin:  Intact without significant lesions or rashes.  Deeply jaundiced.  Multiple non-professional tattoos noted all over body. Psych:  Alert and cooperative. Normal mood and affect.  Lab Results:  Recent Labs  09/13/15 0940  WBC 9.6  HGB 12.8*  HCT 36.9*  PLT 378   BMET  Recent Labs  09/13/15 0940  NA 135  K 3.4*  CL 97*  CO2 28  GLUCOSE 127*  BUN 12  CREATININE 0.70  CALCIUM 9.3   LFT  Recent Labs  09/13/15 0940  PROT 6.3*  ALBUMIN 3.3*  AST 822*  ALT 1045*  ALKPHOS 262*  BILITOT 20.6*   Studies/Results: Dg Chest 2 View  09/13/2015  CLINICAL DATA:  Chest pain. Shortness of breath. Coughing, 3 days duration. EXAM: CHEST  2 VIEW COMPARISON:  None. FINDINGS: Heart size is normal. Mediastinal shadows are normal. The lungs are clear. The  vascularity is normal. No effusions. No bony abnormalities. IMPRESSION: Normal chest Electronically Signed   By: Paulina FusiMark  Shogry M.D.   On: 09/13/2015 10:15   [place] Abdomen Limited Ruq  09/13/2015  CLINICAL DATA:  Jaundice EXAM: US ABDOMEN LIMITED - RIGHT UPPER QUADRANT COMPARISON:  None. FINDINGS: Gallbladder: 5 mm polyp fundus of gallbladder. This is nonmobile and does not shadow along the nondependent surface. Gallbladder wall upper normal thickness 3.7 mm. Negative sonographic Murphy sign Common bile duct: Diameter: 5.5 mm. Liver: No focal lesion identified. Within normal limits in parenchymal echogenicity. IMPRESSION: Gallbladder polyp with mild gallbladder wall thickening. Possible adenomyosis. No gallstone or biliary ductal dilatation. Electronically Signed   By: Marlan Palauharles  Clark M.D.   On: 09/13/2015 10:49   IMPRESSION:  -Jaundice with significant transaminitis:  Likely has viral hepatitis vs toxicity/medication induced (takes pills and drugs that he gets from people). -ETOH abuse:  Doubt that this is the cause of his acute symptoms/issue (transaminases this high make it is less likely).  PLAN: -  Viral hepatitis studies pending including CMV and EBV.  Will also check autoimmune markers.   -Trend LFT's and coags. -Neuro checks should be done periodically to monitor for any mental status changes.  ZEHR, JESSICA D.  09/13/2015, 12:24 PM  Pager number [phone]  GI ATTENDING  History, laboratories, x-rays personally reviewed. Patient personally seen and examined. Wife in room. Agree with extensive consultation note as outlined above. Patient with a history of polysubstance abuse, chronic alcoholism, multiple non-professional tattoos, and prior time in prison. Presents now with 2 week prodrome consisting of malaise, nausea, and vomiting. Drinking alcohol up to yesterday. No significant Tylenol use. Minimal NSAIDs. No cold medications. No prior history of liver problems. The patient most likely has acute  viral hepatitis. Serologies pending. Acetaminophen and alcohol levels negative. Prothrombin time normal. Deeply jaundiced but no altered mental status or asterixis. No evidence for acute liver failure. Agree with hydration. Watch for alcohol withdrawal, follow-up multiple serologies, and trend liver tests and prothrombin time. If patient were to demonstrate liver failure with or without mental status change, then urgent referral to tertiary care center with transplantation capabilities. Discussed with patient and wife. Will follow. Thank you  Wilhemina Bonito. Eda Keys., M.D. [facility] Division of Gastroenterology

## 2015-09-13 NOTE — ED Notes (Signed)
Per pt report: pt reports generalized cramps and says his "body is turning yellow." Pt reports nausea and vomiting.  Pt reports being able to tolerate food yesterday and some cake and pizza this morning at 06:30.  Pt a/o x 4 and ambulatory.  Pt reports some SOB.

## 2015-09-14 DIAGNOSIS — K759 Inflammatory liver disease, unspecified: Secondary | ICD-10-CM | POA: Insufficient documentation

## 2015-09-14 DIAGNOSIS — B1710 Acute hepatitis C without hepatic coma: Secondary | ICD-10-CM | POA: Insufficient documentation

## 2015-09-14 LAB — HEPATITIS PANEL, ACUTE
HEP B S AG: NEGATIVE
Hep A IgM: NEGATIVE
Hep B C IgM: NEGATIVE

## 2015-09-14 LAB — COMPREHENSIVE METABOLIC PANEL
ALT: 943 U/L — AB (ref 17–63)
AST: 797 U/L — AB (ref 15–41)
Albumin: 3.1 g/dL — ABNORMAL LOW (ref 3.5–5.0)
Alkaline Phosphatase: 290 U/L — ABNORMAL HIGH (ref 38–126)
Anion gap: 8 (ref 5–15)
BUN: 6 mg/dL (ref 6–20)
CHLORIDE: 103 mmol/L (ref 101–111)
CO2: 28 mmol/L (ref 22–32)
CREATININE: 0.63 mg/dL (ref 0.61–1.24)
Calcium: 9.1 mg/dL (ref 8.9–10.3)
GFR calc Af Amer: >60 mL/min (ref >60)
GLUCOSE: 101 mg/dL — AB (ref 65–99)
Potassium: 3.5 mmol/L (ref 3.5–5.1)
Sodium: 139 mmol/L (ref 135–145)
Total Bilirubin: 13.9 mg/dL — ABNORMAL HIGH (ref 0.3–1.2)
Total Protein: 6.1 g/dL — ABNORMAL LOW (ref 6.5–8.1)

## 2015-09-14 LAB — CBC
HCT: 37.2 % — ABNORMAL LOW (ref 39.0–52.0)
Hemoglobin: 12.8 g/dL — ABNORMAL LOW (ref 13.0–17.0)
MCH: 31.7 pg (ref 26.0–34.0)
MCHC: 34.4 g/dL (ref 30.0–36.0)
MCV: 92.1 fL (ref 78.0–100.0)
PLATELETS: 398 10*3/uL (ref 150–400)
RBC: 4.04 MIL/uL — ABNORMAL LOW (ref 4.22–5.81)
RDW: 16 % — AB (ref 11.5–15.5)
WBC: 6.5 10*3/uL (ref 4.0–10.5)

## 2015-09-14 LAB — PROTIME-INR
INR: 0.97 (ref 0.00–1.49)
Prothrombin Time: 13.1 seconds (ref 11.6–15.2)

## 2015-09-14 LAB — HIV ANTIBODY (ROUTINE TESTING W REFLEX): HIV Screen 4th Generation wRfx: NONREACTIVE

## 2015-09-14 MED ORDER — HYDROMORPHONE HCL 1 MG/ML IJ SOLN
1.0000 mg | INTRAMUSCULAR | Status: DC | PRN
  Administered 2015-09-14: 1 mg via INTRAVENOUS
  Filled 2015-09-14: qty 1

## 2015-09-14 NOTE — Progress Notes (Addendum)
HISTORY OF PRESENT ILLNESS:  Vincent Craig is a 30 y.o. male who was evaluated yesterday for abnormal liver tests with jaundice. See that dictation. No new complaints. Feeling slightly better. Tolerating diet. Wife in room.  REVIEW OF SYSTEMS:  All non-GI ROS negative except for  History reviewed. No pertinent past medical history.  History reviewed. No pertinent past surgical history.  Social History Vincent Craig  reports that he has been smoking Cigarettes.  He has been smoking about 2.50 packs per day. He does not have any smokeless tobacco history on file. He reports that he drinks alcohol. He reports that he uses illicit drugs (Marijuana).  family history is not on file.  No Known Allergies     PHYSICAL EXAMINATION: Vital signs: BP 104/74 mmHg  Pulse 81  Temp(Src) 97.9 F (36.6 C) (Oral)  Resp 16  Ht 5\' 11"  (1.803 m)  Wt 160 lb (72.576 kg)  BMI 22.33 kg/m2  SpO2 100%  Constitutional: generally well-appearing with obvious jaundice, no acute distress Psychiatric: alert and oriented x3, cooperative Eyes: extraocular movements intact, anicteric, conjunctiva pink Mouth: oral pharynx moist, no lesions Neck: supple no lymphadenopathy Cardiovascular: heart regular rate and rhythm, no murmur Lungs: clear to auscultation bilaterally Abdomen: soft, nontender, nondistended, no obvious ascites, no peritoneal signs, normal bowel sounds, no organomegaly Extremities: no lower extremity edema bilaterally Skin: Jaundice with multiple tattoos on visible extremities and elsewhere Neuro: No focal deficits. No asterixis.    ASSESSMENT:  #1. Acute hepatitis. Looks like the patient has hepatitis C. However, hepatitis C generally does not present with this biochemical picture. Other entities being assessed for and pending. No evidence for liver failure. I discussed with him the importance of complete abstinence from alcohol. We also discussed the natural history of hepatitis C and the  importance of seeking therapy. Continue to trend liver tests, clinical status, and prothrombin time. Await other studies. Will check quantitative hepatitis C RNA We'll follow  Vincent Craig, Jr., M.D. [facility] of Gastroenterology

## 2015-09-14 NOTE — Progress Notes (Signed)
Patient ID: Vincent Craig, male   DOB: 09/14/1985, 30 y.o.   MRN: [id number]  [facility] PROGRESS NOTE  Vincent Craig WUJ:[id number] DOB: 01/31/1986 DOA: 09/13/2015 PCP: No primary care provider on file.   Brief narrative:    Pt is 30 yo male with known alcohol use, typically drinks 3 24 pack cases beer per day, since age of 89, presented to [facility] emergency department for evaluation of several weeks duration of progressively worsening jaundice, generalized myalgias, arthralgias, fatigue, poor oral intake. He also reports subjective fevers and chills, intermittently productive cough of yellow sputum, night sweats. Patient denies chest pain, no abdominal pain, no specific urinary concerns other than urine color that bright yellow. Patient denies recent traveling and reports using IV drugs once in his life.  In emergency department, patient is hemodynamically stable, vital signs stable. Blood work notable for AST 822, ALT 1045, bilirubin 20. Abdominal ultrasound with mild gallbladder wall thickening but no gallstones. [facility] asked to admit for further evaluation. GI consultation requested.   Assessment/Plan:    Active Poblems: Jaundice with significant transaminitis - most likely viral  - Hepatitis panel pending, LFT's trending down  - Provide IV fluids, avoid hepatotoxic medications  - Follow up on GI team recommendations  - HIV and UDS pending   Hypokalemia - Supplemented and WNL this AM   Alcohol abuse - Cessation consult provided - Keep on CIWA protocol  Lovenox for DVT prophylaxis  Code Status: Full.  Family Communication:  plan of care discussed with the patient Disposition Plan: Home when liver function stabilizes   IV access:  Peripheral IV  Procedures and diagnostic studies:    Dg Chest 2 View 09/13/2015 Normal chest   US Abdomen Limited Ruq 09/13/2015 Gallbladder polyp with mild gallbladder wall thickening. Possible adenomyosis. No gallstone or biliary  ductal dilatation.   Medical Consultants:  GI  Other Consultants:  None   IAnti-Infectives:   None   Debbora Presto, MD  [facility] Pager [phone]  If 7PM-7AM, please contact night-coverage [email or web address] Password [email or web address] 09/14/2015, 12:19 PM   LOS: 1 day   HPI/Subjective: No events overnight.   Objective: Filed Vitals:   09/13/15 1454 09/13/15 1543 09/13/15 2154 09/14/15 0456  BP: 132/82 123/69 123/60 104/74  Pulse: 92 76 80 81  Temp:  98.3 F (36.8 C) 98.2 F (36.8 C) 97.9 F (36.6 C)  TempSrc:  Oral Oral Oral  Resp: 16 16 16 16   Height:      Weight:      SpO2: 97% 97% 100% 100%   No intake or output data in the 24 hours ending 09/14/15 1219  Exam:   General:  Pt is alert, follows commands appropriately, not in acute distress, jaundiced   Cardiovascular: Regular rate and rhythm, S1/S2, no murmurs, no rubs, no gallops  Respiratory: Clear to auscultation bilaterally, no wheezing, no crackles, no rhonchi  Abdomen: Soft, non tender, non distended, bowel sounds present, no guarding  Data Reviewed: Basic Metabolic Panel:  Recent Labs Lab 09/13/15 0940 09/13/15 1654 09/14/15 0553  NA 135  --  139  K 3.4*  --  3.5  CL 97*  --  103  CO2 28  --  28  GLUCOSE 127*  --  101*  BUN 12  --  6  CREATININE 0.70  --  0.63  CALCIUM 9.3  --  9.1  MG  --  2.1  --   PHOS  --  3.1  --    Liver Function Tests:  Recent Labs Lab 09/13/15 0940 09/13/15 1654 09/14/15 0553  AST 822* 859* 797*  ALT 1045* 1022* 943*  ALKPHOS 262* 274* 290*  BILITOT 20.6* 20.4* 13.9*  PROT 6.3* 6.6 6.1*  ALBUMIN 3.3* 3.5 3.1*    Recent Labs Lab 09/13/15 0940  LIPASE 17   CBC:  Recent Labs Lab 09/13/15 0940 09/13/15 1654 09/14/15 0553  WBC 9.6 8.9 6.5  NEUTROABS 6.0  --   --   HGB 12.8* 13.0 12.8*  HCT 36.9* 37.9* 37.2*  MCV 91.6 92.2 92.1  PLT 378 387 398   Scheduled Meds: . enoxaparin (LOVENOX) injection  40 mg Subcutaneous Q24H  . folic acid  1 mg Oral Daily  .  multivitamin with minerals  1 tablet Oral Daily  . sodium chloride  3 mL Intravenous Q12H  . thiamine  100 mg Oral Daily   Or  . thiamine  100 mg Intravenous Daily   Continuous Infusions: . sodium chloride 75 mL/hr at 09/14/15 0331

## 2015-09-15 DIAGNOSIS — B182 Chronic viral hepatitis C: Secondary | ICD-10-CM

## 2015-09-15 LAB — COMPREHENSIVE METABOLIC PANEL
ALBUMIN: 3.1 g/dL — AB (ref 3.5–5.0)
ALT: 891 U/L — AB (ref 17–63)
AST: 690 U/L — AB (ref 15–41)
Alkaline Phosphatase: 299 U/L — ABNORMAL HIGH (ref 38–126)
Anion gap: 11 (ref 5–15)
BILIRUBIN TOTAL: 10 mg/dL — AB (ref 0.3–1.2)
BUN: 7 mg/dL (ref 6–20)
CO2: 26 mmol/L (ref 22–32)
CREATININE: 0.69 mg/dL (ref 0.61–1.24)
Calcium: 9.5 mg/dL (ref 8.9–10.3)
Chloride: 102 mmol/L (ref 101–111)
GFR calc Af Amer: >60 mL/min (ref >60)
GLUCOSE: 114 mg/dL — AB (ref 65–99)
POTASSIUM: 4.3 mmol/L (ref 3.5–5.1)
Sodium: 139 mmol/L (ref 135–145)
TOTAL PROTEIN: 6 g/dL — AB (ref 6.5–8.1)

## 2015-09-15 LAB — CBC
HEMATOCRIT: 37.4 % — AB (ref 39.0–52.0)
Hemoglobin: 13 g/dL (ref 13.0–17.0)
MCH: 32.3 pg (ref 26.0–34.0)
MCHC: 34.8 g/dL (ref 30.0–36.0)
MCV: 93 fL (ref 78.0–100.0)
PLATELETS: 442 10*3/uL — AB (ref 150–400)
RBC: 4.02 MIL/uL — ABNORMAL LOW (ref 4.22–5.81)
RDW: 16.5 % — AB (ref 11.5–15.5)
WBC: 7.4 10*3/uL (ref 4.0–10.5)

## 2015-09-15 LAB — CMV IGM: CMV IgM: <30 AU/mL (ref 0.0–29.9)

## 2015-09-15 LAB — PROTIME-INR
INR: 0.93 (ref 0.00–1.49)
Prothrombin Time: 12.7 seconds (ref 11.6–15.2)

## 2015-09-15 LAB — EPSTEIN-BARR VIRUS VCA, IGM: EBV VCA IgM: <36 U/mL (ref 0.0–35.9)

## 2015-09-15 NOTE — Progress Notes (Signed)
Patient ID: Vincent Craig, male   DOB: Oct 11, 1985, 30 y.o.   MRN: [id number]  [facility] PROGRESS NOTE  Vincent Craig [id number]:[id number] DOB: Oct 11, 1985 DOA: 09/13/2015 PCP: No primary care provider on file.   Brief narrative:    Pt is 30 yo male with known alcohol use, typically drinks 3 24 pack cases beer per day, since age of 30, [facility] for evaluation of several weeks duration of progressively worsening jaundice, generalized myalgias, arthralgias, fatigue, poor oral intake. He also reports subjective fevers and chills, intermittently productive cough of yellow sputum, night sweats. Patient denies chest pain, no abdominal pain, no specific urinary concerns other than urine color that bright yellow. Patient denies recent traveling and reports using IV drugs once in his life.  In emergency department, patient is hemodynamically stable, vital signs stable. Blood work notable for AST 822, ALT 1045, bilirubin 20. Abdominal ultrasound with mild gallbladder wall thickening but no gallstones. [facility] asked to admit for further evaluation. GI consultation requested.   Assessment/Plan:    Active Poblems: Jaundice with significant transaminitis - ? Hep C - LFT's trending down  - Provide IV fluids, avoid hepatotoxic medications  - Follow up on GI team recommendations   Hypokalemia - Supplemented and WNL this AM   Alcohol abuse - Cessation consult provided - Keep on CIWA protocol  Lovenox for DVT prophylaxis  Code Status: Full.  Family Communication:  plan of care discussed with the patient Disposition Plan: Home when liver function stabilizes  IV access:  Peripheral IV  Procedures and diagnostic studies:    Dg Chest 2 View 09/13/2015 Normal chest   [place] Abdomen Limited Ruq 09/13/2015 Gallbladder polyp with mild gallbladder wall thickening. Possible adenomyosis. No gallstone or biliary ductal dilatation.   Medical Consultants:  GI  Other  Consultants:  None   IAnti-Infectives:   None   Debbora PrestoMAGICK-MYERS, ISKRA, MD  [facility] Pager [phone]  If 7PM-7AM, please contact night-coverage [email or web address] Password [email or web address] 09/15/2015, 11:04 AM   LOS: 2 days   HPI/Subjective: No events overnight.   Objective: Filed Vitals:   09/14/15 0456 09/14/15 1500 09/15/15 0016 09/15/15 0638  BP: 104/74 133/73 126/69 111/60  Pulse: 81 72 79 62  Temp: 97.9 F (36.6 C) 98.1 F (36.7 C) 98.2 F (36.8 C) 97.9 F (36.6 C)  TempSrc: Oral Oral Oral Oral  Resp: 16 18 16 16   Height:      Weight:      SpO2: 100% 100% 99% 99%    Intake/Output Summary (Last 24 hours) at 09/15/15 1104 Last data filed at 09/14/15 1700  Gross per 24 hour  Intake    480 ml  Output      1 ml  Net    479 ml    Exam:   General:  Pt is alert, follows commands appropriately, not in acute distress, jaundiced   Cardiovascular: Regular rate and rhythm, S1/S2, no murmurs, no rubs, no gallops  Respiratory: Clear to auscultation bilaterally, no wheezing, no crackles, no rhonchi  Abdomen: Soft, non tender, non distended, bowel sounds present, no guarding  Data Reviewed: Basic Metabolic Panel:  Recent Labs Lab 09/13/15 0940 09/13/15 1654 09/14/15 0553 09/15/15 0603  NA 135  --  139 139  K 3.4*  --  3.5 4.3  CL 97*  --  103 102  CO2 28  --  28 26  GLUCOSE 127*  --  101* 114*  BUN 12  --  6 7  CREATININE 0.70  --  0.63 0.69  CALCIUM 9.3  --  9.1 9.5  MG  --  2.1  --   --   PHOS  --  3.1  --   --    Liver Function Tests:  Recent Labs Lab 09/13/15 0940 09/13/15 1654 09/14/15 0553 09/15/15 0603  AST 822* 859* 797* 690*  ALT 1045* 1022* 943* 891*  ALKPHOS 262* 274* 290* 299*  BILITOT 20.6* 20.4* 13.9* 10.0*  PROT 6.3* 6.6 6.1* 6.0*  ALBUMIN 3.3* 3.5 3.1* 3.1*    Recent Labs Lab 09/13/15 0940  LIPASE 17   CBC:  Recent Labs Lab 09/13/15 0940 09/13/15 1654 09/14/15 0553 09/15/15 0603  WBC 9.6 8.9 6.5 7.4  NEUTROABS 6.0  --   --   --   HGB  12.8* 13.0 12.8* 13.0  HCT 36.9* 37.9* 37.2* 37.4*  MCV 91.6 92.2 92.1 93.0  PLT 378 387 398 442*   Scheduled Meds: . enoxaparin (LOVENOX) injection  40 mg Subcutaneous Q24H  . folic acid  1 mg Oral Daily  . multivitamin with minerals  1 tablet Oral Daily  . sodium chloride  3 mL Intravenous Q12H  . thiamine  100 mg Oral Daily   Or  . thiamine  100 mg Intravenous Daily   Continuous Infusions: . sodium chloride 75 mL/hr at 09/15/15 [phone]

## 2015-09-15 NOTE — Progress Notes (Signed)
GI ATTENDING  SUBJECTIVE:  No new complaints. Wants to leave floor to smoke. IV out, but tolerating regular diet well. No nausea or vomiting. No complaints of pain. No confusion  OBJECTIVE: Still with significantly elevated transaminases. However bilirubin down to 10. Prothrombin time remains normal. Quantitative hepatitis C RNA pending as are various other serologies  PHYSICAL EXAM  Vital signs: BP 111/60 mmHg  Pulse 62  Temp(Src) 97.9 F (36.6 C) (Oral)  Resp 16  Ht 5\' 11"  (1.803 m)  Wt 160 lb (72.576 kg)  BMI 22.33 kg/m2  SpO2 99%  Constitutional: Markedly jaundiced but otherwise generally well-appearing, no acute distress Psychiatric: alert and oriented x3, cooperative but minimally communicative Eyes: extraocular movements intact, marked icterus, conjunctiva pink Mouth: oral pharynx moist, no lesions Neck: supple no lymphadenopathy Cardiovascular: heart regular rate and rhythm, no murmur Lungs: clear to auscultation bilaterally Abdomen: soft, nontender, nondistended, no obvious ascites, no peritoneal signs, normal bowel sounds, no organomegaly Extremities: no lower extremity edema bilaterally Skin: Multiple tattoos as previously noted but no additional lesions on visible extremities Neuro: No focal deficits. No asterixis.   ASSESSMENT: 1. Acute hepatitis. Absolute etiology not ascertained. Toxic versus viral. No evidence for liver failure 2. Hepatitis C. Likely chronic 3. Polysubstance abuse including recreational drugs and alcohol  PLAN: 1. Would recommend observation for one additional day. If doing well with ongoing improvement in liver tests could be discharged home with appropriate follow-up 2. Follow-up outstanding laboratories 3. Must stop using recreational drugs alcohol. Advise on several occasions  Wilhemina BonitoJohn N. Eda KeysPerry, Jr., M.D. [facility] Division of Gastroenterology

## 2015-09-16 ENCOUNTER — Telehealth: Payer: Self-pay | Admitting: Emergency Medicine

## 2015-09-16 LAB — COMPREHENSIVE METABOLIC PANEL
ALT: 860 U/L — AB (ref 17–63)
AST: 615 U/L — AB (ref 15–41)
Albumin: 3.3 g/dL — ABNORMAL LOW (ref 3.5–5.0)
Alkaline Phosphatase: 278 U/L — ABNORMAL HIGH (ref 38–126)
Anion gap: 9 (ref 5–15)
BILIRUBIN TOTAL: 8.1 mg/dL — AB (ref 0.3–1.2)
BUN: 10 mg/dL (ref 6–20)
CALCIUM: 9.6 mg/dL (ref 8.9–10.3)
CO2: 26 mmol/L (ref 22–32)
CREATININE: 0.85 mg/dL (ref 0.61–1.24)
Chloride: 103 mmol/L (ref 101–111)
GFR calc Af Amer: >60 mL/min (ref >60)
Glucose, Bld: 96 mg/dL (ref 65–99)
Potassium: 4.2 mmol/L (ref 3.5–5.1)
Sodium: 138 mmol/L (ref 135–145)
TOTAL PROTEIN: 6.5 g/dL (ref 6.5–8.1)

## 2015-09-16 LAB — CBC
HCT: 40.8 % (ref 39.0–52.0)
Hemoglobin: 14 g/dL (ref 13.0–17.0)
MCH: 32.2 pg (ref 26.0–34.0)
MCHC: 34.3 g/dL (ref 30.0–36.0)
MCV: 93.8 fL (ref 78.0–100.0)
Platelets: 467 10*3/uL — ABNORMAL HIGH (ref 150–400)
RBC: 4.35 MIL/uL (ref 4.22–5.81)
RDW: 16.8 % — AB (ref 11.5–15.5)
WBC: 9.6 10*3/uL (ref 4.0–10.5)

## 2015-09-16 LAB — PROTIME-INR
INR: 0.98 (ref 0.00–1.49)
PROTHROMBIN TIME: 13.2 s (ref 11.6–15.2)

## 2015-09-16 LAB — ANTI-MICROSOMAL ANTIBODY LIVER / KIDNEY: LKM1 AB: 2.2 U (ref 0.0–20.0)

## 2015-09-16 LAB — MITOCHONDRIAL ANTIBODIES: MITOCHONDRIAL M2 AB, IGG: 5.4 U (ref 0.0–20.0)

## 2015-09-16 LAB — ANTI-SMOOTH MUSCLE ANTIBODY, IGG: F-ACTIN AB IGG: 16 U (ref 0–19)

## 2015-09-16 LAB — ANTINUCLEAR ANTIBODIES, IFA: ANTINUCLEAR ANTIBODIES, IFA: NEGATIVE

## 2015-09-16 MED ORDER — LORAZEPAM 1 MG PO TABS: 1.0000 mg | ORAL_TABLET | Freq: Two times a day (BID) | ORAL | Status: AC | PRN

## 2015-09-16 MED ORDER — OXYCODONE HCL 5 MG PO TABS: 5.0000 mg | ORAL_TABLET | ORAL | Status: AC | PRN

## 2015-09-16 MED ORDER — ONDANSETRON HCL 4 MG PO TABS: 4.0000 mg | ORAL_TABLET | Freq: Four times a day (QID) | ORAL | Status: AC | PRN

## 2015-09-16 NOTE — Progress Notes (Signed)
Number for [facility] given to patient and wife per note from L. Hvozdovic, PA-C.

## 2015-09-16 NOTE — Discharge Summary (Signed)
Physician Discharge Summary  Vincent Craig ZOX:[id number] DOB: 1985-11-28 DOA: 09/13/2015  PCP: No primary care provider on file.  Admit date: 09/13/2015 Discharge date: 09/16/2015  Recommendations for Outpatient Follow-up:  1. Pt will need to follow up with PCP in 2-3 weeks post discharge 2. Pt advised to go to liver clinic, information provided   Discharge Diagnoses:  Principal Problem:   Jaundice Active Problems:   Transaminitis   Alcohol abuse   Hypokalemia   Hepatitis   Acute hepatitis C virus infection without hepatic coma   Chronic hepatitis C without hepatic coma (HCC)   Discharge Condition: Stable  Diet recommendation: Heart healthy diet discussed in details    Brief narrative:    Pt is 30 yo male with known alcohol use, typically drinks 3 24 pack cases beer per day, since age of 66, presented to [facility] emergency department for evaluation of several weeks duration of progressively worsening jaundice, generalized myalgias, arthralgias, fatigue, poor oral intake. He also reports subjective fevers and chills, intermittently productive cough of yellow sputum, night sweats. Patient denies chest pain, no abdominal pain, no specific urinary concerns other than urine color that bright yellow. Patient denies recent traveling and reports using IV drugs once in his life.  In emergency department, patient is hemodynamically stable, vital signs stable. Blood work notable for AST 822, ALT 1045, bilirubin 20. Abdominal ultrasound with mild gallbladder wall thickening but no gallstones. TRH asked to admit for further evaluation. GI consultation requested.   Assessment/Plan:    Active Poblems: Jaundice with significant transaminitis - Hep C - LFT's trending down  - Provide IV fluids, avoid hepatotoxic medications  - LFT's trending down - outpatient follow up per GI team recommended, pt given information   Hypokalemia - Supplemented and WNL this AM   Alcohol abuse -  Cessation consult provided - no withdrawal while hospitalized   Code Status: Full.  Family Communication: plan of care discussed with the patient and wife at bedside  Disposition Plan: Home   IV access:  Peripheral IV  Procedures and diagnostic studies:   Dg Chest 2 View 09/13/2015 Normal chest   [place] Abdomen Limited Ruq 09/13/2015 Gallbladder polyp with mild gallbladder wall thickening. Possible adenomyosis. No gallstone or biliary ductal dilatation.   Medical Consultants:  GI  Other Consultants:  None   IAnti-Infectives:   None        Discharge Exam: Filed Vitals:   09/15/15 1400 09/16/15 0528  BP: 132/82 114/50  Pulse: 78 64  Temp: 98.2 F (36.8 C) 98.3 F (36.8 C)  Resp: 18 17   Filed Vitals:   09/15/15 0016 09/15/15 0638 09/15/15 1400 09/16/15 0528  BP: 126/69 111/60 132/82 114/50  Pulse: 79 62 78 64  Temp: 98.2 F (36.8 C) 97.9 F (36.6 C) 98.2 F (36.8 C) 98.3 F (36.8 C)  TempSrc: Oral Oral Oral Oral  Resp: 16 16 18 17   Height:      Weight:      SpO2: 99% 99% 100% 98%    General: Pt is alert, follows commands appropriately, not in acute distress Cardiovascular: Regular rate and rhythm, S1/S2 +, no murmurs, no rubs, no gallops Respiratory: Clear to auscultation bilaterally, no wheezing, no crackles, no rhonchi Abdominal: Soft, non tender, non distended, bowel sounds +, no guarding  Discharge Instructions  Discharge Instructions    Diet - low sodium heart healthy    Complete by:  As directed      Increase activity slowly    Complete  by:  As directed             Medication List    STOP taking these medications        acetaminophen 500 MG tablet  Commonly known as:  TYLENOL     HYDROcodone-acetaminophen 5-325 MG tablet  Commonly known as:  NORCO/VICODIN     ibuprofen 600 MG tablet  Commonly known as:  ADVIL,MOTRIN     penicillin v potassium 500 MG tablet  Commonly known as:  VEETID      sulfamethoxazole-trimethoprim 800-160 MG tablet  Commonly known as:  SEPTRA DS      TAKE these medications        LORazepam 1 MG tablet  Commonly known as:  ATIVAN  Take 1 tablet (1 mg total) by mouth 2 (two) times daily as needed for anxiety.     ondansetron 4 MG tablet  Commonly known as:  ZOFRAN  Take 1 tablet (4 mg total) by mouth every 6 (six) hours as needed for nausea.     oxyCODONE 5 MG immediate release tablet  Commonly known as:  Oxy IR/ROXICODONE  Take 1 tablet (5 mg total) by mouth every 4 (four) hours as needed for moderate pain.           Follow-up Information    Call Debbora PrestoMAGICK-Aura Bibby, MD.   Specialty:  Internal Medicine   Why:  As needed call my cell phone [phone]   Contact information:   [address] [place] [address] [phone]        The results of significant diagnostics from this hospitalization (including imaging, microbiology, ancillary and laboratory) are listed below for reference.     Microbiology: No results found for this or any previous visit (from the past 240 hour(s)).   Labs: Basic Metabolic Panel:  Recent Labs Lab 09/13/15 0940 09/13/15 1654 09/14/15 0553 09/15/15 0603 09/16/15 0520  NA 135  --  139 139 138  K 3.4*  --  3.5 4.3 4.2  CL 97*  --  103 102 103  CO2 28  --  28 26 26   GLUCOSE 127*  --  101* 114* 96  BUN 12  --  6 7 10   CREATININE 0.70  --  0.63 0.69 0.85  CALCIUM 9.3  --  9.1 9.5 9.6  MG  --  2.1  --   --   --   PHOS  --  3.1  --   --   --    Liver Function Tests:  Recent Labs Lab 09/13/15 0940 09/13/15 1654 09/14/15 0553 09/15/15 0603 09/16/15 0520  AST 822* 859* 797* 690* 615*  ALT 1045* 1022* 943* 891* 860*  ALKPHOS 262* 274* 290* 299* 278*  BILITOT 20.6* 20.4* 13.9* 10.0* 8.1*  PROT 6.3* 6.6 6.1* 6.0* 6.5  ALBUMIN 3.3* 3.5 3.1* 3.1* 3.3*    Recent Labs Lab 09/13/15 0940  LIPASE 17   CBC:  Recent Labs Lab 09/13/15 0940 09/13/15 1654 09/14/15 0553  09/15/15 0603 09/16/15 0520  WBC 9.6 8.9 6.5 7.4 9.6  NEUTROABS 6.0  --   --   --   --   HGB 12.8* 13.0 12.8* 13.0 14.0  HCT 36.9* 37.9* 37.2* 37.4* 40.8  MCV 91.6 92.2 92.1 93.0 93.8  PLT 378 387 398 442* 467*    SIGNED: Time coordinating discharge: 30 minutes  MAGICK-Hosea Hanawalt, MD  [facility] 09/16/2015, 9:11 AM Pager [phone]  If 7PM-7AM, please contact night-coverage [email or web address] Password [email or web address]

## 2015-09-16 NOTE — Progress Notes (Signed)
Telemetry discontinued.  Removed and returned box, CCMD notified.

## 2015-09-16 NOTE — Progress Notes (Signed)
Per Dr. Izola PriceMyers, patient is tentatively for discharge later today. Recommend [facility], [phone], with Annamarie Majorawn Drazek, NP.  Elbert EwingsL. Floy Angert, PA-C  Gastroenterology

## 2015-09-16 NOTE — Progress Notes (Signed)
Notified patient of discharge.  Educated patient on discharge instructions, follow-up medications, and lab results.  Patient stated understanding, AVS signed.   Case Management previously spoke to patient with PT recommendations.  Patient given hard copy of prescriptions.  IV previously removed.  Patient gathered belongings.

## 2015-09-16 NOTE — Discharge Instructions (Signed)
Aspartate Aminotransferase (AST) Test WHY AM I HAVING THIS TEST? Aspartate aminotransferase (AST) test identifies suspected liver diseases. AST is an enzyme that is released into the blood when liver or muscle cells are injured. This test is usually done when there are signs of liver problems. WHAT KIND OF SAMPLE IS TAKEN? A blood sample is required for this test. It is usually collected by inserting a needle into a vein. HOW DO I PREPARE FOR THE TEST?  Your health care provider may instruct you to avoid taking medicines, particularly those that require intramuscular injection (IM) 12 hours before the test. Follow your health care provider's instructions.  This test usually requires that a blood sample be collected once each day for three days, and then again in one week. WHAT ARE THE REFERENCE RANGES? Reference ranges are considered healthy ranges established after testing a large group of healthy people. Reference ranges may vary among different people, labs, and hospitals. It is your responsibility to obtain your test results. Ask the lab or department performing the test when and how you will get your results. Reference ranges for AST are the following:  16-21 days old: 35-140 units/L.  Less than 47 years old: 15-60 units/L.  24-81 years old: 15-50 units/L.  29-40 years old: 10-50 units/L.  77-50 years old: 10-40 units/L.  Adult: 0-35 units/L or 0-0.58 microkat/L.  Elderly: The reference range is slightly higher than the adult range. Females tend to have slightly lower levels than males.  WHAT DO THE RESULTS MEAN? Results higher than the reference ranges may indicate:  Liver diseases.  Skeletal muscle diseases.  Other disease that destroy the red blood cells or tissues of the pancreas. Results lower than the reference ranges may indicate:  Acute kidney disease.  Pregnancy.  Diabetic ketoacidosis.  Chronic kidney dialysis.  Symptomatic vitamin B1 deficiency. Talk with  your health care provider to discuss your results, treatment options, and if necessary, the need for more tests. Talk with your health care provider if you have any questions about your results.   This information is not intended to replace advice given to you by your health care provider. Make sure you discuss any questions you have with your health care provider.   Document Released: 09/16/2004 Document Revised: 09/14/2014 Document Reviewed: 01/22/2014 Elsevier Interactive Patient Education 2016 Elsevier Inc.  Alanine Aminotransferase Test WHY AM I HAVING THIS TEST? Alanine aminotransferase (ALT) is an enzyme that is found mainly in the liver but also in the kidney, heart, and skeletal muscle. Under normal conditions, ALT levels in the blood are low. Elevated levels may be caused by:  Damage to the liver. This is the main cause of an abnormality.  Damage to the kidney, heart, and skeletal muscle.  Certain medicines. ALT is tested in long-standing liver disease. It may be used to monitor the treatment of people who have liver disease or to see if the treatment is working. ALT may be ordered either by itself or along with other tests. WHAT KIND OF SAMPLE IS TAKEN? A blood sample is required for this test. It is usually collected by inserting a needle into a vein. HOW DO I PREPARE FOR THE TEST? There is no preparation or fasting for this test. WHAT ARE THE REFERENCE RANGES? Reference ranges are considered healthy ranges established after testing a large group of healthy people. Reference ranges may vary among different people, labs, and hospitals. It is your responsibility to obtain your test results. Ask the lab or department performing the  test when and how you will get your results. The reference ranges for the ALT test are as follows:  Adult or child: 4-36 International Units per liter.  Elderly adult: may be slightly higher than adult values.  Infant: may be twice as high as adult  values.  Values may be higher in men and African Americans. WHAT DO THE RESULTS MEAN? Test results that are significantly elevated may indicate:  Hepatitis.  Liver tissue death (hepatic necrosis).  Decreased blood flow to the liver (hepatic ischemia). Test results that are moderately elevated may indicate:  Cirrhosis.  Liver tumor.  Medicines that are causing strain on the liver.  Severe burns. Test results that are mildly elevated may indicate:  Pancreatitis.  Heart attack.  Infectious mononucleosis.  Shock. Talk with your health care provider to discuss your results, treatment options, and if necessary, the need for more tests. Talk with your health care provider if you have any questions about your results.   This information is not intended to replace advice given to you by your health care provider. Make sure you discuss any questions you have with your health care provider.   Document Released: 09/15/2004 Document Revised: 09/14/2014 Document Reviewed: 01/25/2014 Elsevier Interactive Patient Education [employer]2016 Elsevier Inc.

## 2015-09-18 LAB — HEPATITIS C GENOTYPE

## 2015-09-19 ENCOUNTER — Encounter (HOSPITAL_COMMUNITY): Payer: Self-pay | Admitting: Emergency Medicine

## 2015-09-19 ENCOUNTER — Emergency Department (HOSPITAL_COMMUNITY)
Admission: EM | Admit: 2015-09-19 | Discharge: 2015-09-19 | Disposition: A | Discharge status: Home / Self Care | Payer: Self-pay | Attending: Emergency Medicine | Admitting: Emergency Medicine

## 2015-09-19 DIAGNOSIS — B182 Chronic viral hepatitis C: Secondary | ICD-10-CM

## 2015-09-19 DIAGNOSIS — F17210 Nicotine dependence, cigarettes, uncomplicated: Secondary | ICD-10-CM | POA: Insufficient documentation

## 2015-09-19 DIAGNOSIS — R7401 Elevation of levels of liver transaminase levels: Secondary | ICD-10-CM

## 2015-09-19 DIAGNOSIS — R740 Nonspecific elevation of levels of transaminase and lactic acid dehydrogenase [LDH]: Secondary | ICD-10-CM

## 2015-09-19 LAB — COMPREHENSIVE METABOLIC PANEL
ALK PHOS: 182 U/L — AB (ref 38–126)
ALT: 503 U/L — ABNORMAL HIGH (ref 17–63)
ANION GAP: 11 (ref 5–15)
AST: 261 U/L — ABNORMAL HIGH (ref 15–41)
Albumin: 3.8 g/dL (ref 3.5–5.0)
BUN: 13 mg/dL (ref 6–20)
CALCIUM: 10 mg/dL (ref 8.9–10.3)
CO2: 29 mmol/L (ref 22–32)
Chloride: 100 mmol/L — ABNORMAL LOW (ref 101–111)
Creatinine, Ser: 0.81 mg/dL (ref 0.61–1.24)
Glucose, Bld: 79 mg/dL (ref 65–99)
Potassium: 4.5 mmol/L (ref 3.5–5.1)
SODIUM: 140 mmol/L (ref 135–145)
TOTAL PROTEIN: 7.1 g/dL (ref 6.5–8.1)
Total Bilirubin: 5.7 mg/dL — ABNORMAL HIGH (ref 0.3–1.2)

## 2015-09-19 LAB — URINALYSIS, ROUTINE W REFLEX MICROSCOPIC
GLUCOSE, UA: NEGATIVE mg/dL
HGB URINE DIPSTICK: NEGATIVE
KETONES UR: NEGATIVE mg/dL
Leukocytes, UA: NEGATIVE
Nitrite: NEGATIVE
PH: 6 (ref 5.0–8.0)
Protein, ur: NEGATIVE mg/dL
Specific Gravity, Urine: 1.019 (ref 1.005–1.030)

## 2015-09-19 LAB — CBC
HEMATOCRIT: 44.2 % (ref 39.0–52.0)
Hemoglobin: 14.4 g/dL (ref 13.0–17.0)
MCH: 31.9 pg (ref 26.0–34.0)
MCHC: 32.6 g/dL (ref 30.0–36.0)
MCV: 98 fL (ref 78.0–100.0)
Platelets: 493 10*3/uL — ABNORMAL HIGH (ref 150–400)
RBC: 4.51 MIL/uL (ref 4.22–5.81)
RDW: 15.7 % — AB (ref 11.5–15.5)
WBC: 9.5 10*3/uL (ref 4.0–10.5)

## 2015-09-19 LAB — PROTIME-INR
INR: 1 (ref 0.00–1.49)
Prothrombin Time: 13.4 s (ref 11.6–15.2)

## 2015-09-19 LAB — HCV RNA QUANT RFLX ULTRA OR GENOTYP
HCV QUANT: 20065 [IU]/mL — AB (ref <15)
HCV Quantitative Log: 4.3 {Log} — ABNORMAL HIGH (ref <1.18)
HCV RNA Qnt(log copy/mL): 4.747 log10 IU/mL
HepC Qn: 55800 IU/mL

## 2015-09-19 LAB — LIPASE, BLOOD: Lipase: 27 U/L (ref 11–51)

## 2015-09-19 MED ORDER — SODIUM CHLORIDE 0.9 % IV BOLUS (SEPSIS)
1000.0000 mL | Freq: Once | INTRAVENOUS | Status: AC
  Administered 2015-09-19: 1000 mL via INTRAVENOUS

## 2015-09-19 MED ORDER — PROMETHAZINE HCL 25 MG PO TABS: 12.5000 mg | ORAL_TABLET | Freq: Four times a day (QID) | ORAL | Status: AC | PRN

## 2015-09-19 MED ORDER — LORAZEPAM 1 MG PO TABS
1.0000 mg | ORAL_TABLET | Freq: Once | ORAL | Status: AC
  Administered 2015-09-19: 1 mg via ORAL
  Filled 2015-09-19: qty 1

## 2015-09-19 MED ORDER — ONDANSETRON HCL 4 MG/2ML IJ SOLN
4.0000 mg | Freq: Once | INTRAMUSCULAR | Status: AC
  Administered 2015-09-19: 4 mg via INTRAVENOUS
  Filled 2015-09-19: qty 2

## 2015-09-19 NOTE — Progress Notes (Signed)
[facility] to assist with medication cost.  Patient recently discharged with RX for ativan and zofran.  [place].  [facility] coupon for Ativan from [email or web address].  EDPA will provide Rx for phenergan.  EDCM unable to assist with cost of [facility]ivan [facility].  [facility] at bedside.  [facility] provided [facility] with coupon from good [facility] for ativan.  Male at bedside reports they have the written RX for ativan.  EDCM instructed patient to take prescription for ativan and phenergan to [facility] for discounted prices.  Patient agreeable to cost.  [facility] also provided patient with list of alcohol and drug rehabs in [place], and contact information [facility] to establish care for pcp.  [facility] strongly encouraged patient to establish care with pcp and discussed appropriate use of the ED.  Patient voiced understanding.  Discussed with EDPA.  No further EDCM needs at this time.

## 2015-09-19 NOTE — ED Provider Notes (Signed)
CSN: [id number]     Arrival date & time 09/19/15  0844 History   First MD Initiated Contact with Patient 09/19/15 1733     No chief complaint on file.    (Consider location/radiation/quality/duration/timing/severity/associated sxs/prior Treatment) HPI   Vincent Craig is a 30 y.o M with a pmhx of alcohol abuse, recent Hepatitis C diagnosis and recent admission for transaminitis and hyperbilirubinemia presents to the ED today c/o muscle cramping and N/V. Pt presented to the ED on 1/6 with similar symptoms and was found to be jaundiced and had transaminitis (ALT 1045, AST 822), bilirubin of 20. Pt was admitted and found to have hepatitis C. Pt was drinking 3 24packs of beer per day. Pt was discharged on 1/9 with GI follow up. Since discharge pt denies alcohol consumption. Pt states that his symptoms were resolved until yesterday when he noticed cramps in his bilateral hands and feet as well as in his lower abdomen. Pt states that he noticed that his eyes were more yellow and he had several episodes of NBNB emesis. Denies fever, diarrhea, melena, hematochezia, syncope, diaphoresis.   History reviewed. No pertinent past medical history. History reviewed. No pertinent past surgical history. History reviewed. No pertinent family history. Social History  Substance Use Topics  . Smoking status: Current Every Day Smoker -- 2.50 packs/day    Types: Cigarettes  . Smokeless tobacco: None  . Alcohol Use: Yes     Comment: 3-4 cases of beer/day    Review of Systems  All other systems reviewed and are negative.     Allergies  Review of patient's allergies indicates no known allergies.  Home Medications   Prior to Admission medications   Medication Sig Start Date End Date Taking? Authorizing Provider  oxyCODONE (OXY IR/ROXICODONE) 5 MG immediate release tablet Take 1 tablet (5 mg total) by mouth every 4 (four) hours as needed for moderate pain. 09/16/15  Yes Dorothea Ogle, MD  LORazepam (ATIVAN)  1 MG tablet Take 1 tablet (1 mg total) by mouth 2 (two) times daily as needed for anxiety. 09/16/15   Dorothea Ogle, MD  ondansetron (ZOFRAN) 4 MG tablet Take 1 tablet (4 mg total) by mouth every 6 (six) hours as needed for nausea. 09/16/15   Dorothea Ogle, MD   BP 129/79 mmHg  Pulse 79  Temp(Src) 98 F (36.7 C) (Oral)  Resp 18  SpO2 100% Physical Exam  Constitutional: He is oriented to person, place, and time. He appears well-developed and well-nourished. No distress.  HENT:  Head: Normocephalic and atraumatic.  Mouth/Throat: Oropharynx is clear and moist. No oropharyngeal exudate.  Eyes: Conjunctivae and EOM are normal. Pupils are equal, round, and reactive to light. Right eye exhibits no discharge. Left eye exhibits no discharge.  Mild scleral icterus present.  Neck: Neck supple.  Cardiovascular: Normal rate, regular rhythm, normal heart sounds and intact distal pulses.  Exam reveals no gallop and no friction rub.   No murmur heard. Pulmonary/Chest: Effort normal and breath sounds normal. No respiratory distress. He has no wheezes. He has no rales. He exhibits no tenderness.  Abdominal: Soft. Bowel sounds are normal. He exhibits no distension and no mass. There is no tenderness. There is no rebound and no guarding.  Musculoskeletal: Normal range of motion. He exhibits no edema.  Lymphadenopathy:    He has no cervical adenopathy.  Neurological: He is alert and oriented to person, place, and time. No cranial nerve deficit.  Strength 5/5 throughout. No sensory deficits.  No gait abnormality.  Skin: Skin is warm and dry. No rash noted. He is not diaphoretic. No erythema. No pallor.  No jaundice.  Psychiatric: He has a normal mood and affect. His behavior is normal.  Nursing note and vitals reviewed.   ED Course  Procedures (including critical care time) Labs Review Labs Reviewed  COMPREHENSIVE METABOLIC PANEL - Abnormal; Notable for the following:    Chloride 100 (*)    AST 261 (*)     ALT 503 (*)    Alkaline Phosphatase 182 (*)    Total Bilirubin 5.7 (*)    All other components within normal limits  CBC - Abnormal; Notable for the following:    RDW 15.7 (*)    Platelets 493 (*)    All other components within normal limits  URINALYSIS, ROUTINE W REFLEX MICROSCOPIC (NOT AT [facility]) - Abnormal; Notable for the following:    Color, Urine AMBER (*)    APPearance CLOUDY (*)    Bilirubin Urine SMALL (*)    All other components within normal limits  LIPASE, BLOOD  PROTIME-INR    Imaging Review No results found. I have personally reviewed and evaluated these images and lab results as part of my medical decision-making.   EKG Interpretation None      MDM   Final diagnoses:  Transaminitis  Chronic hepatitis C without hepatic coma (HCC)    30 y.o M with a recent diagnosis of Hep C presents for N/V and muscle cramping. Pt recenlty presented to the ED with similar symptoms as was found to have profound transaminitis and hyperbilirubinemia. Hep C diagnosis was made at that time. Pt was drinking 3, 24 cases of beer per day. Pt was discharge on 1/9 with a prescription for ativan, zofran and pain medication which he was unable to afford so he never picked up. Pt denies alcohol use since discharge. Pt states that his current symptoms are not nearly as severe as they were on his initial ED presentation. Upon review of his records, pts liver enzymes are significantly improved from his discharge lab values. UA negative for infection. Lipase wnl. Pt given fluid bolus with zofran. Case manager consulted to help pt find affordable pain medication. Pt also given resources for alcohol abuse rehabilitation programs. Dose of home ativan given here. No sign of DTs. Muscle cramping improved after fluid bolus. Pt safe to follow up with GI provider. Pt is hemodynamically stable and ready for discharge.     Lester KinsmanSamantha Tripp WayneDowless, PA-C 09/22/15 2150  Leta BaptistEmily Roe Nguyen, MD 09/27/15 [phone]

## 2015-09-19 NOTE — Discharge Instructions (Signed)
Hepatitis C Hepatitis C is a viral infection of the liver. It can lead to scarring of the liver (cirrhosis), liver failure, or liver cancer. Hepatitis C may go undetected for months or years because people with the infection may not have symptoms, or they may have only mild symptoms. CAUSES  Hepatitis C is caused by the hepatitis C virus (HCV). The virus can be passed from one person to another through:  Blood.  Contaminated needles, such as those used for tattooing, body piercing, acupuncture, or injecting drugs.  Having unprotected sex with an infected person.  Childbirth.  Blood transfusions or organ transplants done in the [place] before 1992. RISK FACTORS Risk factors for hepatitis C include:  Having unprotected sex with an infected person.  Using illegal drugs. SIGNS AND SYMPTOMS  Symptoms of hepatitis C may include:  Fatigue.  Loss of appetite.  Nausea.  Vomiting.  Abdominal pain.  Dark yellow urine.  Yellowish skin and eyes (jaundice).  Itching of the skin.  Clay-colored bowel movements.  Joint pain. Symptoms are not always present.  DIAGNOSIS  Hepatitis C is diagnosed with blood tests. Other types of tests may also be done to check how your liver is functioning. TREATMENT  Your health care provider may perform noninvasive tests or a liver biopsy to help determine the best course of treatment. Treatment for hepatitis C may include one or more medicines. Your health care provider may check you for a recurring infection or other liver conditions every 6-12 months after treatment. HOME CARE INSTRUCTIONS   Rest as needed.  Take all medicines as directed by your health care provider.  Do not take any medicine unless approved by your health care provider. This includes over-the-counter medicine and birth control pills.  Do not drink alcohol.  Do not have sex until approved by your health care provider.  Do not share toothbrushes, nail clippers,  razors, or needles. PREVENTION There is no vaccine for hepatitis C. The only way to prevent the disease is to reduce the risk of exposure to the virus. This may be done by:  Practicing safe sex and using condoms.  Avoiding illegal drugs. SEEK MEDICAL CARE IF:  You have a fever.  You develop abdominal pain.  You develop dark urine.  You have clay-colored bowel movements.  You develop joint pains. SEEK IMMEDIATE MEDICAL CARE IF:  You have increasing fatigue or weakness.  You lose your appetite.  You feel nauseous or vomit.  You develop jaundice or your jaundice gets worse.  You bruise or bleed easily. MAKE SURE YOU:   Understand these instructions.  Will watch your condition.  Will get help right away if you are not doing well or get worse.   This information is not intended to replace advice given to you by your health care provider. Make sure you discuss any questions you have with your health care provider.   Keep follow up appointment with GI provider. Follow up with PCP. Avoid alcohol consumption. Return to the ED if you experience severe worsening of your symptoms, fever, blood in stool or vomit, severe abdominal pain.

## 2015-09-19 NOTE — ED Notes (Signed)
Pt discharged from Hospital on 09-15-14, began feeling nausea, emesis, and body cramping onset 09-16-14. Pt was hospitalized, had acute liver issues with no history of the same. History of heavy alcohol use, not currently drinking alcohol since admission.

## 2015-09-21 LAB — HEPATITIS E ANTIBODY, IGM

## 2015-09-21 LAB — HEPATITIS E ANTIBODY, IGG

## 2017-02-21 IMAGING — DX DG CHEST 2V
2 series · 2 of 2 positions shown · non-contrast
Comparison: None.

CLINICAL DATA: Chest pain. Shortness of breath. Coughing, 3 days
duration.

EXAM:
CHEST  2 VIEW

[chest pa]
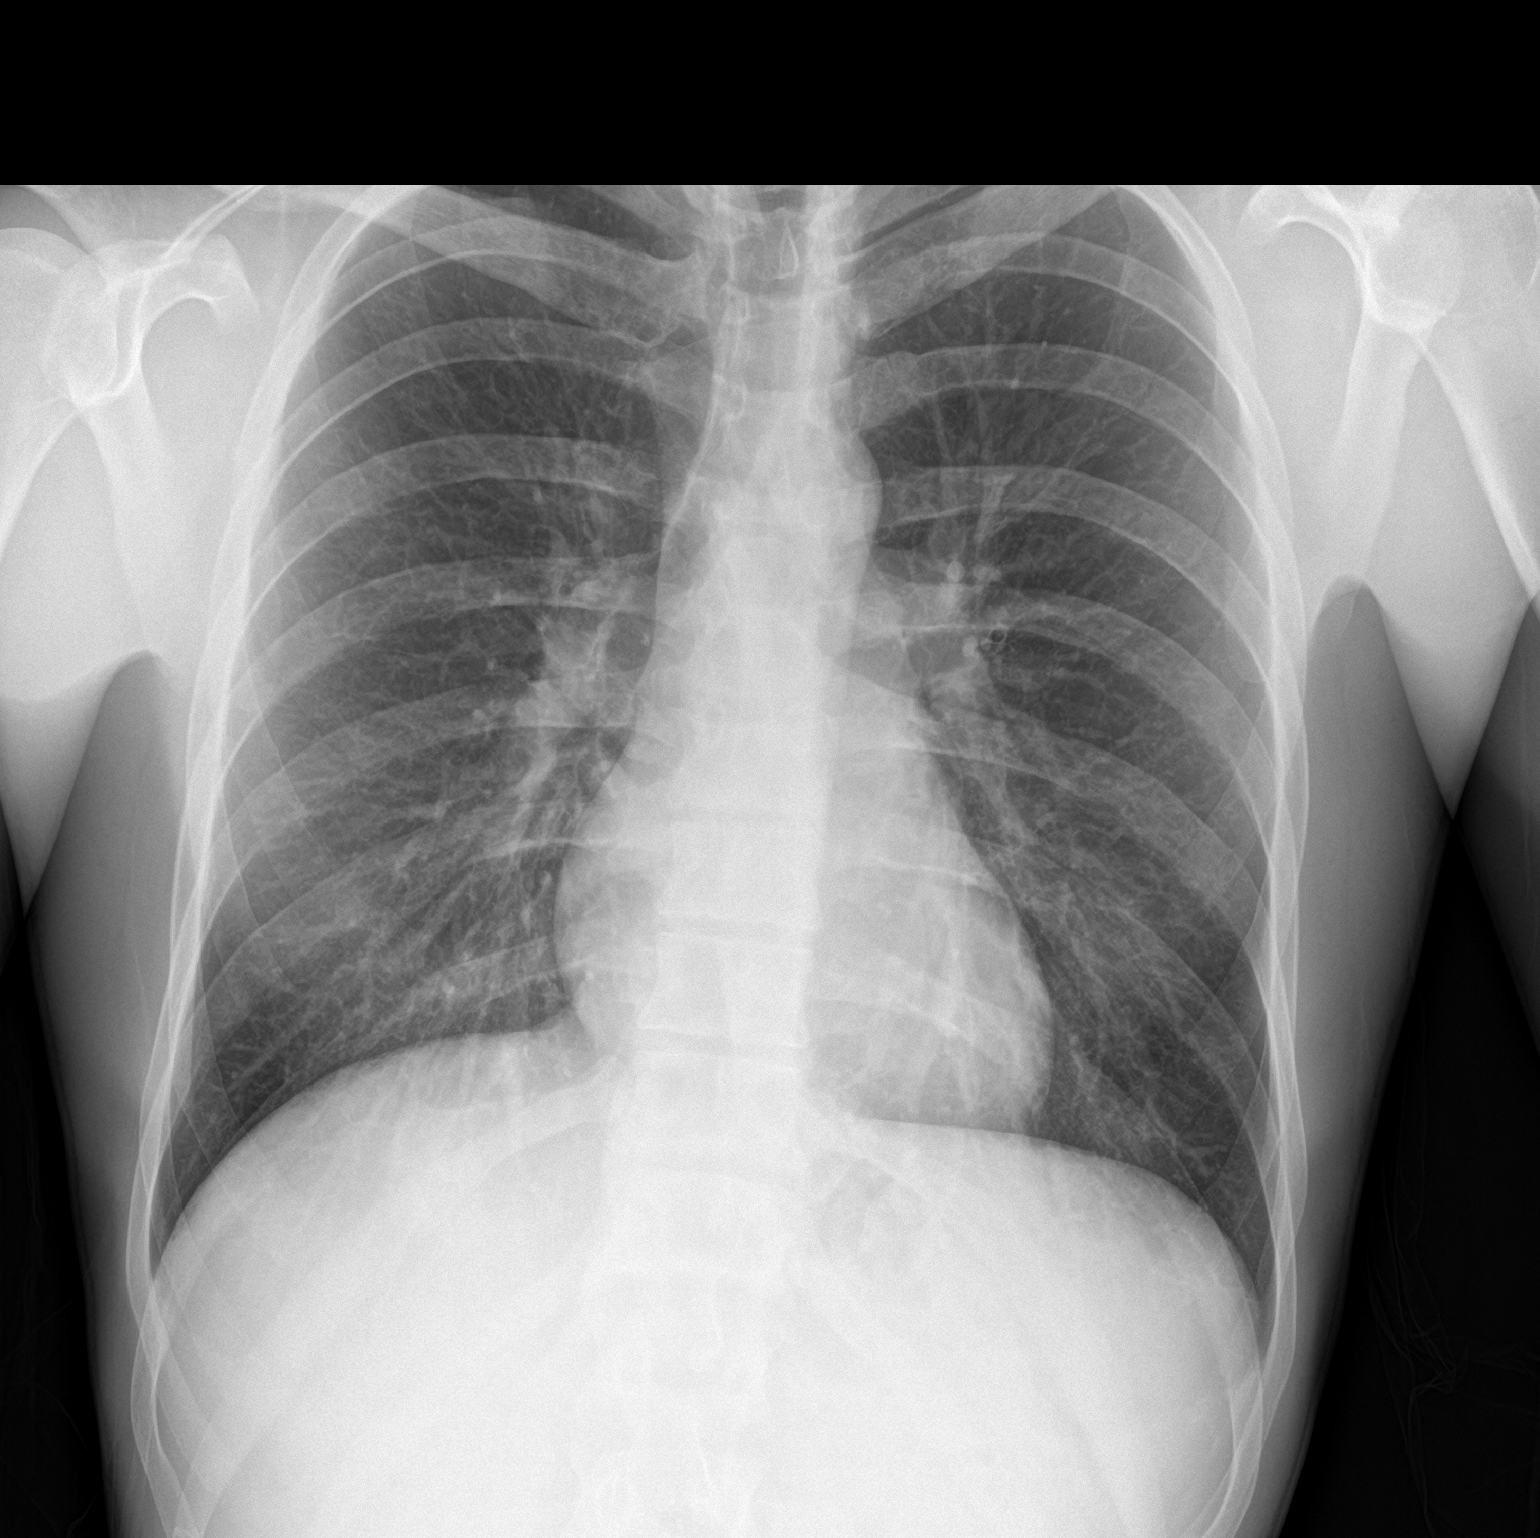

[chest lat]
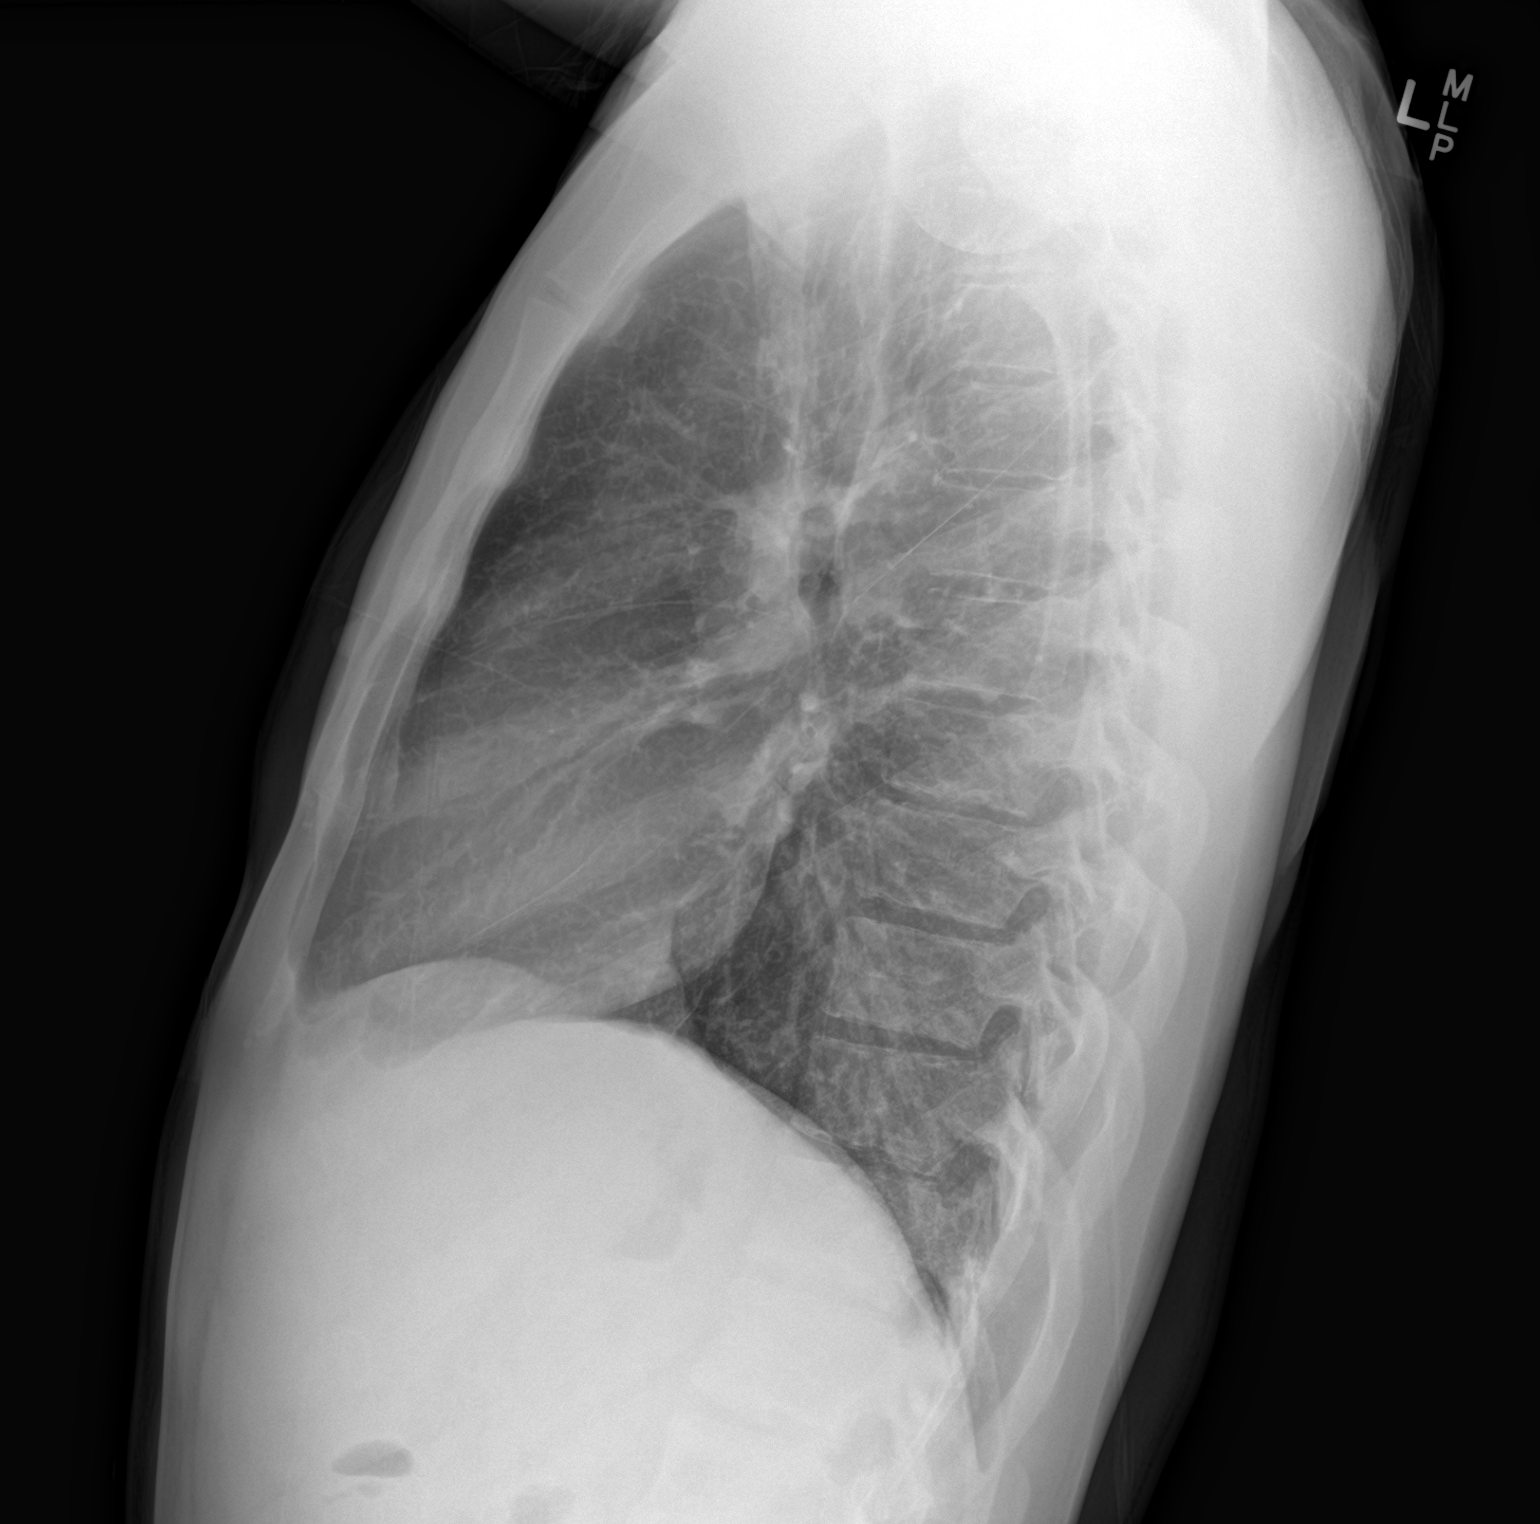

[2 of 2 positions shown; findings below may reference images not displayed]

FINDINGS: Heart size is normal. Mediastinal shadows are normal. The lungs are
clear. The vascularity is normal. No effusions. No bony
abnormalities.
IMPRESSION: Normal chest

## 2017-11-03 IMAGING — US US ABDOMEN LIMITED
1 series · 14 of 25 positions shown · non-contrast
Comparison: None.

CLINICAL DATA: Jaundice

EXAM:
US ABDOMEN LIMITED - RIGHT UPPER QUADRANT

[Series 1: us abdomen limited · 0.22mm/px · 14 of 118 slices shown]
[im 1/118]
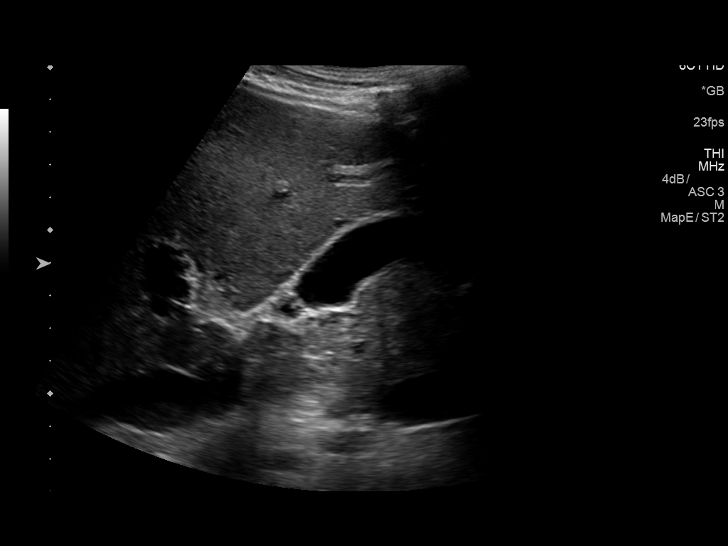
[im 10/118]
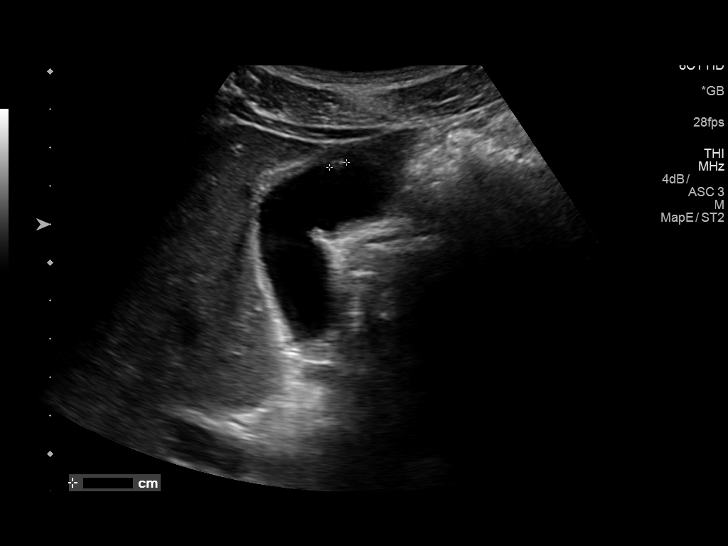
[im 20/118]
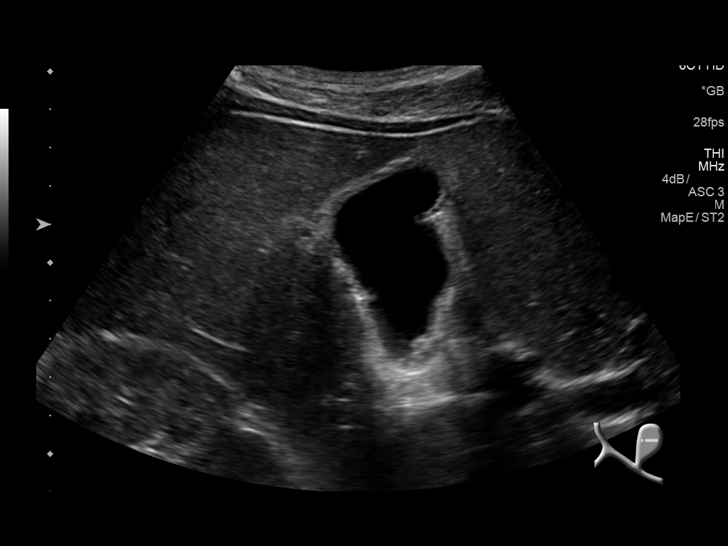
[im 30/118]
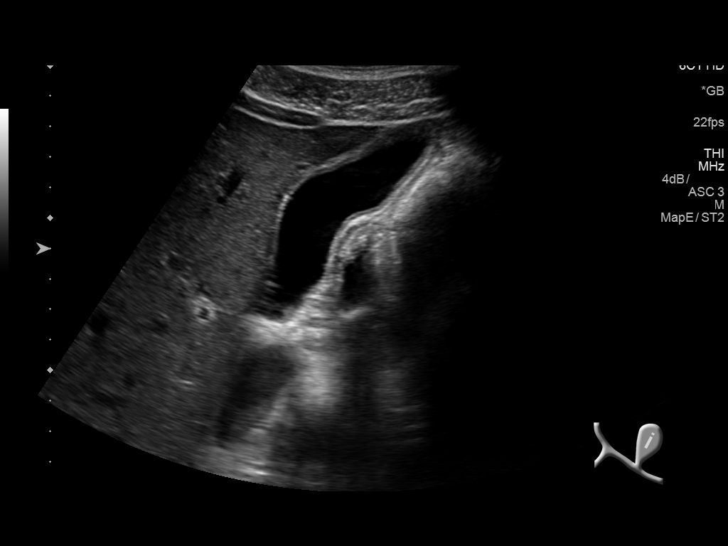
[im 40/118]
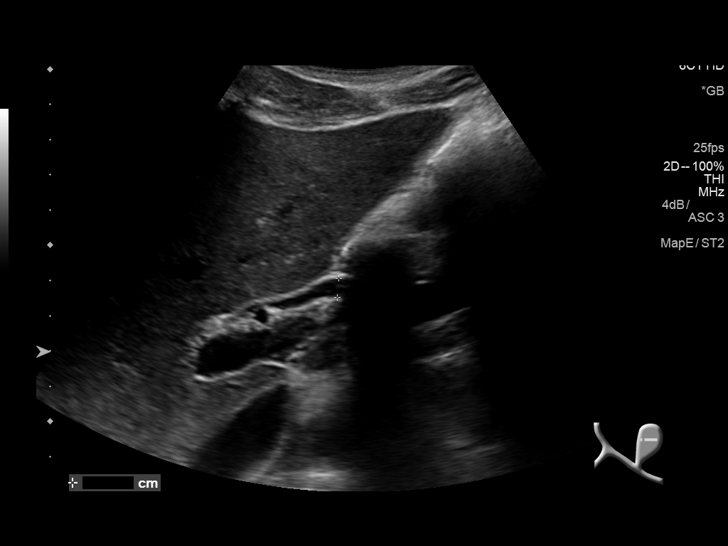
[im 44/118]
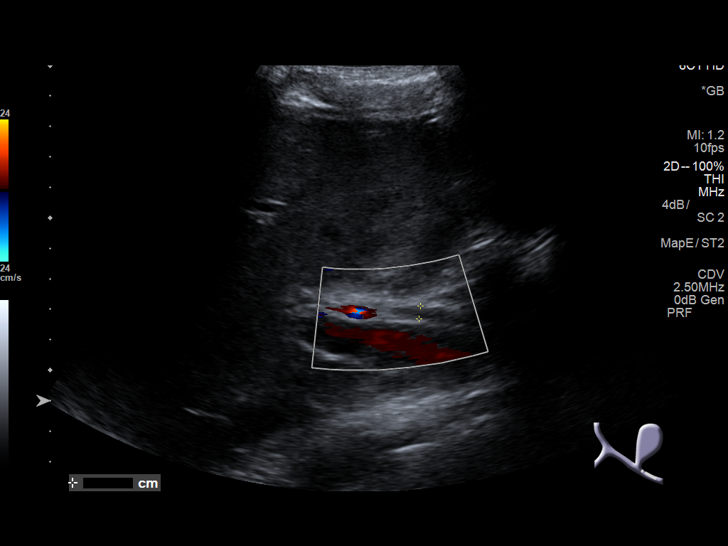
[im 54/118]
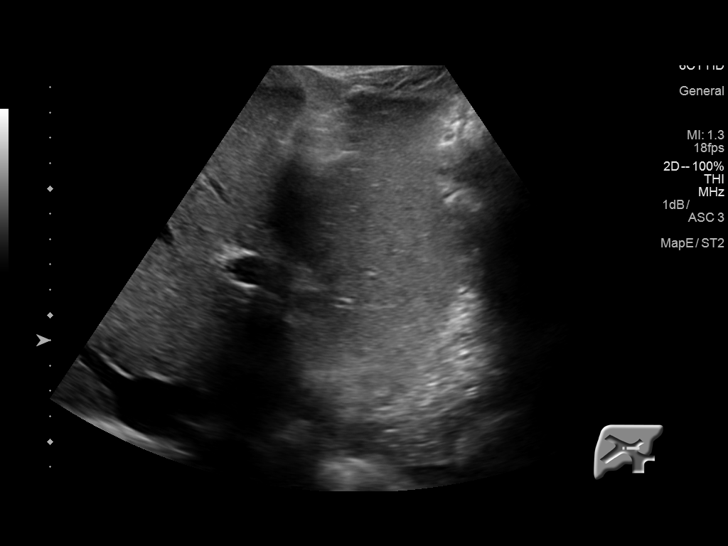
[im 64/118]
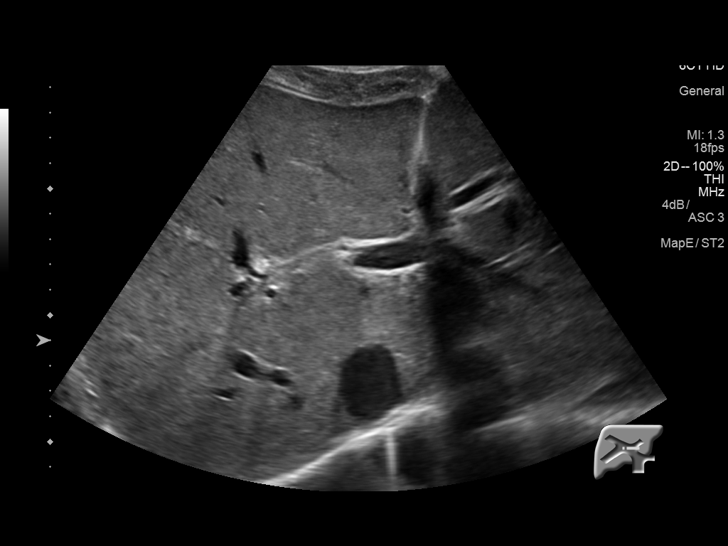
[im 74/118]
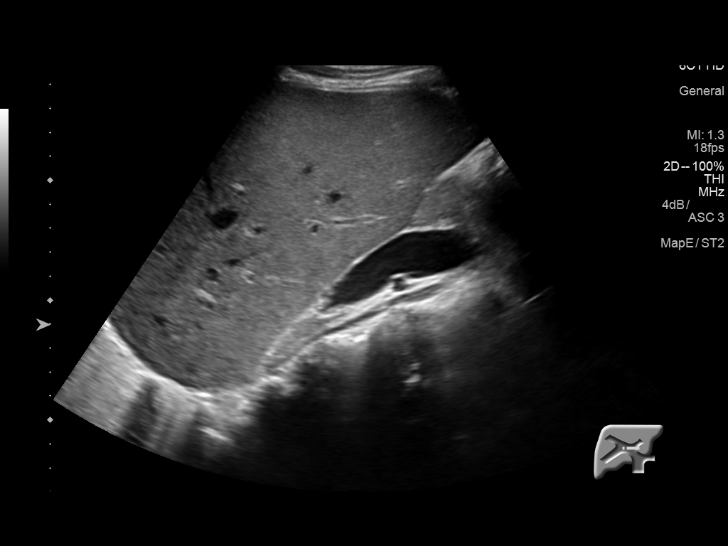
[im 79/118]
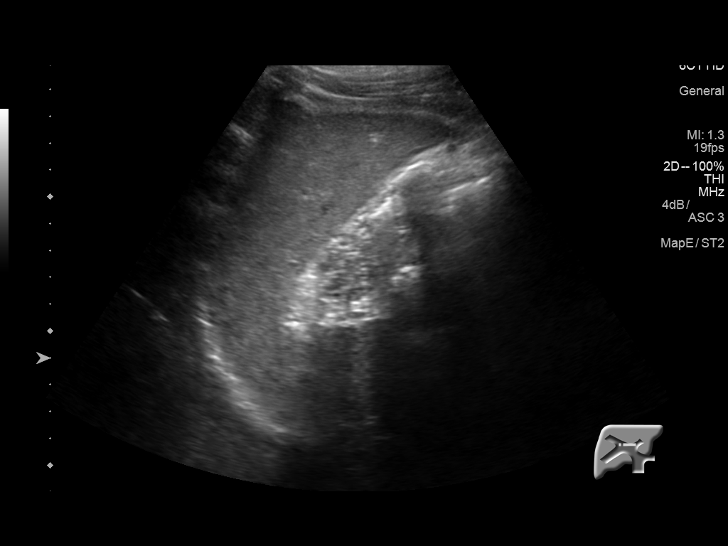
[im 88/118]
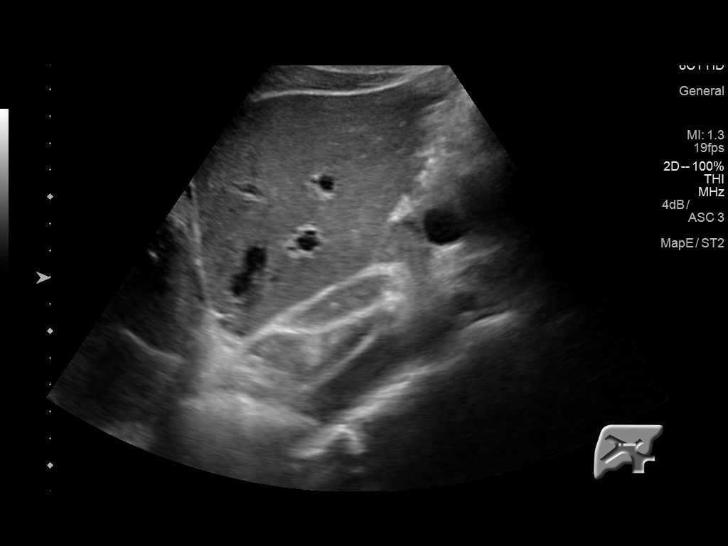
[im 98/118]
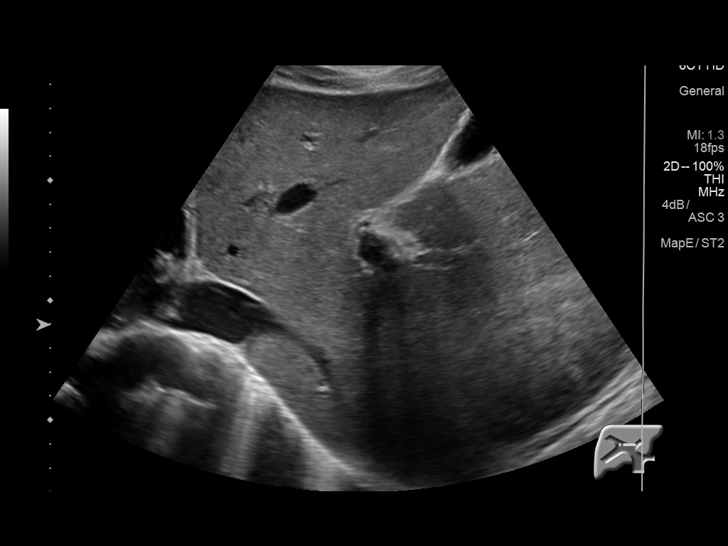
[im 108/118]
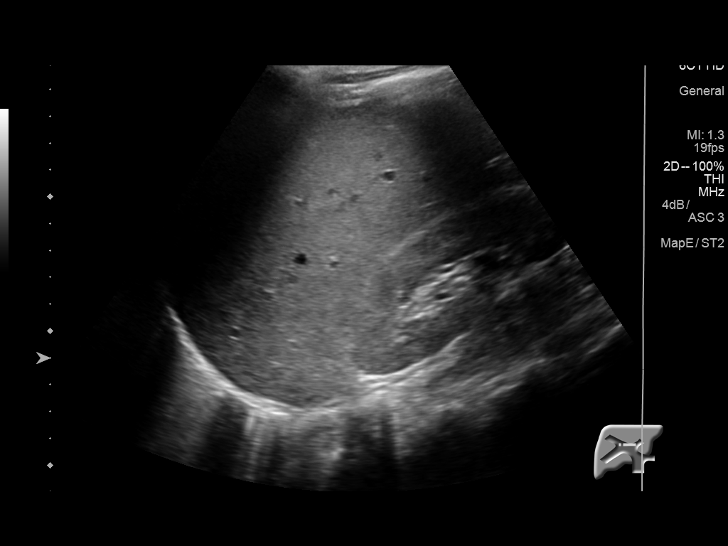
[im 118/118]
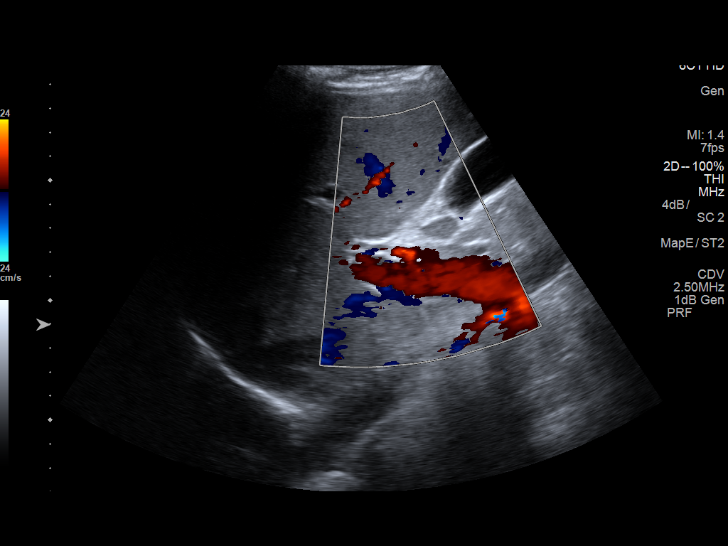

[14 of 25 positions shown; findings below may reference images not displayed]

FINDINGS: Gallbladder:

5 mm polyp fundus of gallbladder. This is nonmobile and does not
shadow along the nondependent surface. Gallbladder wall upper normal
thickness 3.7 mm. Negative sonographic Murphy sign

Common bile duct:

Diameter: 5.5 mm.

Liver:

No focal lesion identified. Within normal limits in parenchymal
echogenicity.
IMPRESSION: Gallbladder polyp with mild gallbladder wall thickening. Possible
adenomyosis. No gallstone or biliary ductal dilatation.
